# Patient Record
Sex: Female | Born: 1935 | Race: White | Hispanic: No | Smoking: Former smoker
Health system: Southern US, Community
[De-identification: ages and names within clinical notes are randomized; demographics above are authoritative.]

## PROBLEM LIST (undated history)

## (undated) DIAGNOSIS — K55059 Acute (reversible) ischemia of intestine, part and extent unspecified: Secondary | ICD-10-CM

## (undated) DIAGNOSIS — K219 Gastro-esophageal reflux disease without esophagitis: Secondary | ICD-10-CM

## (undated) DIAGNOSIS — K4130 Unilateral femoral hernia, with obstruction, without gangrene, not specified as recurrent: Secondary | ICD-10-CM

## (undated) DIAGNOSIS — E785 Hyperlipidemia, unspecified: Secondary | ICD-10-CM

## (undated) DIAGNOSIS — K559 Vascular disorder of intestine, unspecified: Secondary | ICD-10-CM

## (undated) DIAGNOSIS — K5790 Diverticulosis of intestine, part unspecified, without perforation or abscess without bleeding: Secondary | ICD-10-CM

## (undated) DIAGNOSIS — E039 Hypothyroidism, unspecified: Secondary | ICD-10-CM

## (undated) DIAGNOSIS — I10 Essential (primary) hypertension: Secondary | ICD-10-CM

## (undated) HISTORY — DX: Unilateral femoral hernia, with obstruction, without gangrene, not specified as recurrent: K41.30

## (undated) HISTORY — DX: Hyperlipidemia, unspecified: E78.5

## (undated) HISTORY — DX: Acute (reversible) ischemia of intestine, part and extent unspecified: K55.059

## (undated) HISTORY — DX: Vascular disorder of intestine, unspecified: K55.9

## (undated) HISTORY — DX: Diverticulosis of intestine, part unspecified, without perforation or abscess without bleeding: K57.90

## (undated) HISTORY — DX: Gastro-esophageal reflux disease without esophagitis: K21.9

## (undated) HISTORY — DX: Hypothyroidism, unspecified: E03.9

## (undated) HISTORY — DX: Essential (primary) hypertension: I10

---

## 1998-12-28 ENCOUNTER — Other Ambulatory Visit: Admission: RE | Admit: 1998-12-28 | Discharge: 1998-12-28 | Payer: Self-pay | Admitting: Family Medicine

## 2000-06-22 ENCOUNTER — Encounter: Admission: RE | Admit: 2000-06-22 | Discharge: 2000-06-22 | Payer: Self-pay | Admitting: Family Medicine

## 2000-06-22 ENCOUNTER — Encounter: Payer: Self-pay | Admitting: Family Medicine

## 2001-04-16 ENCOUNTER — Other Ambulatory Visit: Admission: RE | Admit: 2001-04-16 | Discharge: 2001-04-16 | Payer: Self-pay | Admitting: Family Medicine

## 2002-06-18 ENCOUNTER — Encounter: Payer: Self-pay | Admitting: Family Medicine

## 2002-06-18 ENCOUNTER — Encounter: Admission: RE | Admit: 2002-06-18 | Discharge: 2002-06-18 | Payer: Self-pay | Admitting: Family Medicine

## 2002-07-30 ENCOUNTER — Inpatient Hospital Stay (HOSPITAL_COMMUNITY): Admission: EM | Admit: 2002-07-30 | Discharge: 2002-07-31 | Payer: Self-pay | Admitting: Internal Medicine

## 2002-07-30 ENCOUNTER — Encounter: Payer: Self-pay | Admitting: Internal Medicine

## 2002-08-11 ENCOUNTER — Encounter: Payer: Self-pay | Admitting: Internal Medicine

## 2003-04-23 ENCOUNTER — Other Ambulatory Visit: Admission: RE | Admit: 2003-04-23 | Discharge: 2003-04-23 | Payer: Self-pay | Admitting: Family Medicine

## 2004-04-04 ENCOUNTER — Ambulatory Visit (HOSPITAL_COMMUNITY): Admission: RE | Admit: 2004-04-04 | Discharge: 2004-04-04 | Payer: Self-pay | Admitting: Family Medicine

## 2004-04-15 ENCOUNTER — Encounter: Admission: RE | Admit: 2004-04-15 | Discharge: 2004-04-15 | Payer: Self-pay | Admitting: Family Medicine

## 2005-04-28 ENCOUNTER — Other Ambulatory Visit: Admission: RE | Admit: 2005-04-28 | Discharge: 2005-04-28 | Payer: Self-pay | Admitting: Family Medicine

## 2006-07-10 ENCOUNTER — Encounter: Admission: RE | Admit: 2006-07-10 | Discharge: 2006-07-10 | Payer: Self-pay | Admitting: Family Medicine

## 2007-06-24 ENCOUNTER — Other Ambulatory Visit: Admission: RE | Admit: 2007-06-24 | Discharge: 2007-06-24 | Payer: Self-pay | Admitting: Family Medicine

## 2007-07-15 ENCOUNTER — Encounter: Admission: RE | Admit: 2007-07-15 | Discharge: 2007-07-15 | Payer: Self-pay | Admitting: Family Medicine

## 2008-07-28 ENCOUNTER — Encounter: Admission: RE | Admit: 2008-07-28 | Discharge: 2008-07-28 | Payer: Self-pay | Admitting: Geriatric Medicine

## 2009-08-31 ENCOUNTER — Encounter: Admission: RE | Admit: 2009-08-31 | Discharge: 2009-08-31 | Payer: Self-pay | Admitting: Geriatric Medicine

## 2009-11-23 ENCOUNTER — Other Ambulatory Visit: Admission: RE | Admit: 2009-11-23 | Discharge: 2009-11-23 | Payer: Self-pay | Admitting: Geriatric Medicine

## 2010-03-29 ENCOUNTER — Encounter: Payer: Self-pay | Admitting: Internal Medicine

## 2010-03-29 ENCOUNTER — Telehealth: Payer: Self-pay | Admitting: Internal Medicine

## 2010-03-30 ENCOUNTER — Ambulatory Visit: Payer: Self-pay | Admitting: Internal Medicine

## 2010-03-30 DIAGNOSIS — I10 Essential (primary) hypertension: Secondary | ICD-10-CM | POA: Insufficient documentation

## 2010-03-30 DIAGNOSIS — IMO0001 Reserved for inherently not codable concepts without codable children: Secondary | ICD-10-CM

## 2010-03-30 DIAGNOSIS — E039 Hypothyroidism, unspecified: Secondary | ICD-10-CM | POA: Insufficient documentation

## 2010-03-30 DIAGNOSIS — E785 Hyperlipidemia, unspecified: Secondary | ICD-10-CM

## 2010-03-30 DIAGNOSIS — K5730 Diverticulosis of large intestine without perforation or abscess without bleeding: Secondary | ICD-10-CM | POA: Insufficient documentation

## 2010-03-30 LAB — CONVERTED CEMR LAB
BUN: 14 mg/dL (ref 6–23)
Basophils Absolute: 0.1 10*3/uL (ref 0.0–0.1)
Calcium: 9.9 mg/dL (ref 8.4–10.5)
Creatinine, Ser: 1 mg/dL (ref 0.4–1.2)
GFR calc non Af Amer: 60.01 mL/min (ref >60.00)
Glucose, Bld: 119 mg/dL — ABNORMAL HIGH (ref 70–99)
Lymphocytes Relative: 19.6 % (ref 12.0–46.0)
Lymphs Abs: 2.2 10*3/uL (ref 0.7–4.0)
Monocytes Relative: 6.2 % (ref 3.0–12.0)
Neutrophils Relative %: 73.2 % (ref 43.0–77.0)
Platelets: 188 10*3/uL (ref 150.0–400.0)
RDW: 13.6 % (ref 11.5–14.6)

## 2010-03-31 ENCOUNTER — Ambulatory Visit: Payer: Self-pay | Admitting: Internal Medicine

## 2010-04-22 ENCOUNTER — Ambulatory Visit: Payer: Self-pay | Admitting: Internal Medicine

## 2010-04-22 DIAGNOSIS — K55059 Acute (reversible) ischemia of intestine, part and extent unspecified: Secondary | ICD-10-CM

## 2010-05-02 ENCOUNTER — Telehealth: Payer: Self-pay | Admitting: Internal Medicine

## 2010-05-19 ENCOUNTER — Encounter: Payer: Self-pay | Admitting: Internal Medicine

## 2010-05-19 ENCOUNTER — Ambulatory Visit
Admission: RE | Admit: 2010-05-19 | Discharge: 2010-05-19 | Payer: Self-pay | Source: Home / Self Care | Attending: Internal Medicine | Admitting: Internal Medicine

## 2010-05-24 NOTE — Letter (Signed)
Summary: CT Letter  [facility]  [address] [place], [place] [address]   Phone: [phone]  Fax: [phone]    [facility] CT Division  03/30/2010  Patient's Name: Lacey Rodriguez MRN: [id number]  The [place] Matter (Patient Protection and [employer] Act, Sections [phone] and [phone], dated March 23. 2010) mandates that we provide information to Medicare/Medicaid patients on facilities other than those owned by [employer].   Section [phone] of the Act amends the in-office ancillary services exception under [place] laws by requiring a referring physician to inform patients in writing, at the time of a referral, that the patients may obtain specified imaging services such as a CT exam from an entity other than the designated imaging equipment requested from the referring physician.  The provision specifically requires the referring physician to provide the patient with a written list of suppliers who furnish such services in the area in which the patient resides.  [facility] imaging is pleased to provide you with the Medicare required information as it relates to CT services so that you an make an informed decision. [facility] takes pride in delivering high quality services where same day and next day exams are available. When your exam is performed at the [facility] CT scanner, the results are accessible to physicians and are retained in your electronic medical chart, where your physician can review the results at any time and compare them to any previous or future images you may have.  We hope you will continue to allow [place] the privilege of using our [place] services. Your scheduler has provided you a list of Imaging Centers within a radius of this office and your signature below indicates you have been given this information to make a wise choice.         Patient Signature:            Date:         The above signature verifies that the patient was given this letter and  a choice in their imaging location.

## 2010-05-24 NOTE — Letter (Signed)
Summary: New Patient letter  [facility]  [address] [place], [place] [address]   Phone: [phone]  Fax: [phone]       03/29/2010 MRN: [id number]  Lacey Rodriguez [address] [place] [address] [place], [place]  [address]  Dear [name],  Welcome to the Gastroenterology Division at [facility].    You are scheduled to see Dr.  Leone Payor on 05-13-10 at 2pm on the 3rd floor at [facility], [address]  We ask that you try to arrive at our office 15 minutes prior to your appointment time to allow for check-in.  We would like you to complete the enclosed self-administered evaluation form prior to your visit and bring it with you on the day of your appointment.  We will review it with you.  Also, please bring a complete list of all your medications or, if you prefer, bring the medication bottles and we will list them.  Please bring your insurance card so that we may make a copy of it.  If your insurance requires a referral to see a specialist, please bring your referral form from your primary care physician.  Co-payments are due at the time of your visit and may be paid by cash, check or credit card.     Your office visit will consist of a consult with your physician (includes a physical exam), any laboratory testing he/she may order, scheduling of any necessary diagnostic testing (e.g. x-ray, ultrasound, CT-scan), and scheduling of a procedure (e.g. Endoscopy, Colonoscopy) if required.  Please allow enough time on your schedule to allow for any/all of these possibilities.    If you cannot keep your appointment, please call [phone] to cancel or reschedule prior to your appointment date.  This allows [place] the opportunity to schedule an appointment for another patient in need of care.  If you do not cancel or reschedule by 5 p.m. the business day prior to your appointment date, you will be charged a $50.00 late cancellation/no-show fee.    Thank you for choosing  [facility] for your medical needs.  We appreciate the opportunity to care for you.  Please visit [place] at our website  to learn more about our practice.                     Sincerely,                                                             The Gastroenterology Division

## 2010-05-24 NOTE — Assessment & Plan Note (Signed)
Summary: Rectal bleeding/abdominal pain/LRH   History of Present Illness Visit Type: Follow-up Visit Primary GI MD: Stan Head MD [facility] Primary Provider: Merlene Laughter, MD Chief Complaint: rectal bleeding abdominal pain since Monday night History of Present Illness:   77 YO FEMALE KNOWN TO DR. Leone Payor WITH HX OF DIVERTICULOSIS. SHE HAD AN EPISODE OF SEGMENTAL ISCHEMIC COLITIS IN 2004,AND WAS HOSPITALIZED. SHE HAD COLONOSCOPY AT THAT TIME AS WELL. SHE COMES IN TODAY AS AN ADD-ON WITH C/O ACUTE ONSET OF INTENSE ABDOMINAL CRAMPING AND PAIN ON  03/28/10 BEFORE SHE WENT TO BED. THIS WAS ASSOCIATED WITH NAUSEA, VOMOTING AND CLAMMINESS. SHE STARTED HAVING BM'S BUT DIDN'T LOOK INITIALLY TO SEE IF SHE WAS PASSING BLOOD. EARLY YESTERDAY MORNING SHE PASSED 2 BM'S THAT WERE GROSSLY BLOODY. SHE SAYS SHE FEELS EXACTLY LIKE SHE DID IN 2004WITH  THE ISCHEMIC COLITIS.  SHE FEELS A LITTLE BETTER TODAY,WAS ABLE TO EAT SOME CEREAL THIS AM,HAD ONE EPISODE OF LOOSE STOOL WITH BLOOD THIS AM, AND NONE DURING THE NIGHT . NO FEVER, NO DIZZINESS.   GI Review of Systems    Reports abdominal pain, nausea, and  vomiting.     Location of  Abdominal pain: lower abdomen.    Denies acid reflux, belching, bloating, chest pain, dysphagia with liquids, dysphagia with solids, heartburn, loss of appetite, vomiting blood, weight loss, and  weight gain.      Reports rectal bleeding.     Denies anal fissure, black tarry stools, change in bowel habit, constipation, diarrhea, diverticulosis, fecal incontinence, heme positive stool, hemorrhoids, irritable bowel syndrome, jaundice, light color stool, liver problems, and  rectal pain. Preventive Screening-Counseling & Management  Alcohol-Tobacco     Smoking Status: quit      Drug Use:  no.      Current Medications (verified): 1)  Alendronate Sodium 10 Mg Tabs (Alendronate Sodium) .Marland Kitchen.. 1 By Mouth Once Daily 2)  Vytorin 10-20 Mg Tabs (Ezetimibe-Simvastatin) .Marland Kitchen.. 1 By Mouth Once  Daily 3)  Synthroid 75 Mcg Tabs (Levothyroxine Sodium) .Marland Kitchen.. 1 By Mouth Once Daily 4)  Amlodipine Besylate 5 Mg Tabs (Amlodipine Besylate) .Marland Kitchen.. 1 By Mouth Once Daily 5)  Hydrochlorothiazide 12.5 Mg Caps (Hydrochlorothiazide) .Marland Kitchen.. 1 By Mouth Once Daily 6)  Prilosec 20 Mg Cpdr (Omeprazole) .Marland Kitchen.. 1 By Mouth Once Daily 7)  Caltrate 600+d Plus 600-400 Mg-Unit Tabs (Calcium Carbonate-Vit D-Min) .Marland Kitchen.. 1 By Mouth Once Daily 8)  Vitamin D 1000 Unit Tabs (Cholecalciferol) .Marland Kitchen.. 1 By Mouth Once Daily  Allergies (verified): 1)  ! Pcn 2)  ! Codeine  Past History:  Past Medical History: Hypothyroidism Hypertension Hyperlipidemia Ischemic Colitis 2004 Diverticulosis Hemorrhoids  Past Surgical History: Appendectomy COLONOSCOPY 2004/SEVERE SIGMOID DIVERTICULOSIS,EXT HEMORRHOIDS,SEGMENTAL ISCHEMIC COLITIS.  Family History: Reviewed history and no changes required. No FH of Colon Cancer: Family History of Liver Cancer:mother  Social History: Reviewed history and no changes required. Divorced  Patient is a former smoker.  Alcohol Use - no Illicit Drug Use - no Smoking Status:  quit Drug Use:  no  Review of Systems  The patient denies allergy/sinus, anemia, anxiety-new, arthritis/joint pain, back pain, blood in urine, breast changes/lumps, change in vision, confusion, cough, coughing up blood, depression-new, fainting, fatigue, fever, headaches-new, hearing problems, heart murmur, heart rhythm changes, itching, menstrual pain, muscle pains/cramps, night sweats, nosebleeds, pregnancy symptoms, shortness of breath, skin rash, sleeping problems, sore throat, swelling of feet/legs, swollen lymph glands, thirst - excessive , urination - excessive , urination changes/pain, urine leakage, vision changes, and voice change.  SEE HPI  Vital Signs:  Patient profile:   77 year old female Height:      64.5 inches Weight:      137 pounds BMI:     23.24 Pulse rate:   80 / minute Pulse rhythm:    regular BP sitting:   130 / 78  (left arm)  Vitals Entered By: Milford Cage NCMA (March 30, 2010 10:26 AM)  Physical Exam  General:  Well developed, well nourished, no acute distress. Head:  Normocephalic and atraumatic. Eyes:  PERRLA, no icterus. Lungs:  Clear throughout to auscultation. Heart:  Regular rate and rhythm; no murmurs, rubs,  or bruits. Abdomen:  SOFT, TENDER LLQ,LMQ, NO GUARDING, NO REBOUND, NO MASS OR HSM,BS+ Rectal:  MUCOID  FRESH BLOOD  ON EXAM GLOVE Extremities:  No clubbing, cyanosis, edema or deformities noted. Neurologic:  Alert and  oriented x4;  grossly normal neurologically. Psych:  Alert and cooperative. Normal mood and affect.   Impression & Recommendations:  Problem # 1:  RECTAL BLEEDING (ICD-569.3) Assessment New 77 YO FEMALE  WITH ACUTE LOWER ABDOMINAL PAIN/CRAMPING,RECTAL BLEEDING X 48 HOURS. SXS ARE VERY CONSISTENT WITH SEGMENTAL ISCHEMIC COLITIS,WHICH PT ALSO HAD IN 2004.  LABS AS BELOW SCHEDULE FOR CT ANGIO ABD/PELVIS PUSH LIQUIDS,SOFT DIET TYLENOL AS NEEDED FOR PAIN BENTYL 10 MG- 3 TIMES DAILYAS NEEDED FOR CRAMPING FOLLOW UP IN OFFICE IN 3 WEEKS ,OR SOONER IF SXS HAVE NOT RESOLVED. Orders: GI Misc Procedure/ Radiology Order (GI Misc ) TLB-BMP (Basic Metabolic Panel-BMET) ([id number]-METABOL) TLB-CBC Platelet - w/Differential ([id number])  Problem # 2:  DIVERTICULOSIS-COLON (ICD-562.10) Assessment: Comment Only  Problem # 3:  HYPERTENSION (ICD-401.9) Assessment: Comment Only  Problem # 4:  HYPOTHYROIDISM (ICD-244.9) Assessment: Comment Only  Problem # 5:  HYPERLIPIDEMIA (ICD-272.4) Assessment: Comment Only  Patient Instructions: 1)  Please go to lab, basement level. 2)  We have scheduled the CT scan at [facility], [address] across from [facility]. 3)  We sent a prescription for Bentyl to [facility], [address].  4)  Copy sent to : Dr Merlene Laughter 5)  We made you a follow up appointment with Dr. Leone Payor on 04-22-2010  at 2:30 PM . Prescriptions: BENTYL 10 MG CAPS (DICYCLOMINE HCL) Take 1 tab 3 times daily as needed for cramping and spasms  #30 x 0   Entered by:   Lowry Ram NCMA   Authorized by:   Sammuel Cooper PA-c   Signed by:   Lowry Ram NCMA on 03/30/2010   Method used:   Electronically to        [id number]* (retail)       [address]       [place], [place]  [address]       Ph: [phone]       Fax: [phone]   RxID:   [id number]

## 2010-05-24 NOTE — Progress Notes (Signed)
Summary: Blood instead of a bm  Phone Note Call from Patient Call back at Home Phone [phone]   Call For: Dr Leone Payor Summary of Call: Says she went to bathroom and nothing but pure blood came out like a BM. Says she had this problem once before back in 2004 and Dr Leone Payor told her if it happenned again he would see her right away. Initial call taken by: Leanor Kail [facility],  March 29, 2010 4:21 PM  Follow-up for Phone Call        Patient states that she started having pain in her lower abdomen last night in the lower part of her stomach. States she had lots of pain, rates it at an 8. Passed large amount of blood in the toilet, she states she is weak but has no SOB. States she did vomit 3 times last night also. Instructed patient to go to the ER to be evaluated. Patient does not want to wait in the ER. Encouraged her to go get checked out. Appointment made with Mike Gip, PA for 03/30/10 at 10:30am. Follow-up by: Selinda Michaels RN,  March 29, 2010 4:53 PM

## 2010-05-26 NOTE — Progress Notes (Signed)
Summary: Questions  Phone Note Call from Patient Call back at Home Phone [phone]   Caller: Patient Call For: Dr. Leone Payor Reason for Call: Talk to Nurse Summary of Call: Pt wants to know if her prep wtih effcet her acid reflux Initial call taken by: [place] Johnson,  May 02, 2010 9:17 AM  Follow-up for Phone Call        patient advised that shouldn't cause a problem. All questions answered. Follow-up by: Darcey Nora RN, CGRN,  May 02, 2010 10:21 AM

## 2010-05-26 NOTE — Assessment & Plan Note (Signed)
Summary: F/U rectal bleeding, abd pain, saw A. Esterwood PA   History of Present Illness Visit Type: Follow-up Visit Primary GI MD: Stan Head MD [facility] Primary Elira Colasanti: Merlene Laughter, MD Chief Complaint: Follow up of rectal bleeding and abdominal pain History of Present Illness:   77 yo ww recently seen by Mike Gip PA-C with LLQ pain and hematochezia that turned out to be ischemic colitis. Patient denies any blood in her stool but does have some left sided abdominal cramping from time to time. She states that she is feeling better since her last office visit.   Not really a pain in LLQ/flank but different than right side. It reminded her of the ischemic colitis she had in 2004.   GI Review of Systems    Reports abdominal pain.     Location of  Abdominal pain: left side.    Denies acid reflux, belching, bloating, chest pain, dysphagia with liquids, dysphagia with solids, heartburn, loss of appetite, nausea, vomiting, vomiting blood, weight loss, and  weight gain.        Denies anal fissure, black tarry stools, change in bowel habit, constipation, diarrhea, diverticulosis, fecal incontinence, heme positive stool, hemorrhoids, irritable bowel syndrome, jaundice, light color stool, liver problems, rectal bleeding, and  rectal pain.    Current Medications (verified): 1)  Alendronate Sodium 10 Mg Tabs (Alendronate Sodium) .Marland Kitchen.. 1 By Mouth Once Daily 2)  Vytorin 10-20 Mg Tabs (Ezetimibe-Simvastatin) .Marland Kitchen.. 1 By Mouth Once Daily 3)  Synthroid 75 Mcg Tabs (Levothyroxine Sodium) .Marland Kitchen.. 1 By Mouth Once Daily 4)  Amlodipine Besylate 5 Mg Tabs (Amlodipine Besylate) .Marland Kitchen.. 1 By Mouth Once Daily 5)  Hydrochlorothiazide 12.5 Mg Caps (Hydrochlorothiazide) .Marland Kitchen.. 1 By Mouth Once Daily 6)  Prilosec 20 Mg Cpdr (Omeprazole) .Marland Kitchen.. 1 By Mouth Once Daily (Holding) 7)  Caltrate 600+d Plus 600-400 Mg-Unit Tabs (Calcium Carbonate-Vit D-Min) .Marland Kitchen.. 1 By Mouth Once Daily 8)  Vitamin D 1000 Unit Tabs (Cholecalciferol)  .Marland Kitchen.. 1 By Mouth Once Daily 9)  Bentyl 10 Mg Caps (Dicyclomine Hcl) .... Take One By Mouth As Needed  Allergies (verified): 1)  ! Pcn 2)  ! Codeine  Past History:  Past Medical History: Reviewed history from 03/30/2010 and no changes required. Hypothyroidism Hypertension Hyperlipidemia Ischemic Colitis 2004 Diverticulosis Hemorrhoids  Past Surgical History: Reviewed history from 03/30/2010 and no changes required. Appendectomy COLONOSCOPY 2004/SEVERE SIGMOID DIVERTICULOSIS,EXT HEMORRHOIDS,SEGMENTAL ISCHEMIC COLITIS.  Family History: Reviewed history from 03/30/2010 and no changes required. No FH of Colon Cancer: Family History of Liver Cancer: mother  Social History: Divorced  Patient is a former smoker.  Alcohol Use - no Illicit Drug Use - no Daily Caffeine Use rare  Vital Signs:  Patient profile:   77 year old female Height:      64.5 inches Weight:      138 pounds BMI:     23.41 Pulse rate:   78 / minute Pulse rhythm:   regular BP sitting:   144 / 70  (left arm) Cuff size:   regular  Vitals Entered By: Harlow Mares CMA Duncan Dull) (April 22, 2010 2:07 PM)  Physical Exam  General:  Well developed, well nourished, no acute distress. Abdomen:  soft and nontender BS+   Impression & Recommendations:  Problem # 1:  ACUTE VASCULAR INSUFFICIENCY OF INTESTINE (ICD-557.0) Assessment Improved I think it would be prudent to examine the colon for any chronic changes given the overall scenario. She could need evaluation by a vascular surgeon re: is there any role for revascularization with  this second episode though does not appear to have a critical stenosis of the IMA. Will see what the colonoscopy shows and discuss further with her. I will also ask vascular suregry what they think. 15 mins tiime spent with her on this today. Risks, benefits,and indications of endoscopic procedure(s) were reviewed with the patient and all questions answered.  Orders: Colonoscopy  (Colon)  Patient Instructions: 1)  You have been scheduled for a colonoscopy. Please follow written prep instructions that were given to you today at your visit.  2)  Please pick up your prescription for Moviprep at the pharmacy. An electronic presription has already been sent.  3)  Copy sent to : Dr H.Stoneking 4)  The medication list was reviewed and reconciled.  All changed / newly prescribed medications were explained.  A complete medication list was provided to the patient / caregiver. Prescriptions: MOVIPREP 100 GM  SOLR (PEG-KCL-NACL-NASULF-NA ASC-C) As per prep instructions.  #1 x 0   Entered by:   Lamona Curl CMA (AAMA)   Authorized by:   Iva Boop MD, [facility]   Signed by:   Lamona Curl CMA (AAMA) on 04/22/2010   Method used:   Electronically to        [facility]* (retail)       [address]       [place], [place]  [address]       Ph: [phone]       Fax: [phone]   RxID:   [id number]

## 2010-05-26 NOTE — Procedures (Signed)
Summary: COLON   Colonoscopy  Procedure date:  08/11/2002  Findings:      Location:  [facility].   Patient Name: Lacey Rodriguez, Lacey Rodriguez MRN:  Procedure Procedures: Colonoscopy CPT: 380 [id number] 6425.    with biopsy. CPT: Q5068410.  Personnel: Endoscopist: Iva Boop, MD, [facility].  Referred By: Barbera Setters Artis Flock, MD.  Exam Location: Exam performed in Outpatient Clinic. Outpatient  Patient Consent: Procedure, Alternatives, Risks and Benefits discussed, consent obtained, from patient. Consent was obtained by the RN.  Indications Symptoms: Diarrhea Hematochezia. Abdominal pain / bloating.  Comments: Suspected ischemic colitis. Symptoms now about resolved. Hgb remains normal. History  Pre-Exam Physical: Performed Aug 11, 2002. Cardio-pulmonary exam, Rectal exam, HEENT exam , Abdominal exam, Mental status exam WNL.  Exam Exam: Extent of exam reached: Cecum, extent intended: Cecum.  The cecum was identified by appendiceal orifice and IC valve. Patient position: left side to back. Colon retroflexion performed. Images taken. ASA Classification: II. Tolerance: excellent.  Monitoring: Pulse and BP monitoring, Oximetry used. Supplemental O2 given.  Colon Prep Used Golytely for colon prep. Prep results: excellent.  Sedation Meds: Patient assessed and found to be appropriate for moderate (conscious) sedation. Sedation was managed by the Endoscopist. Fentanyl 50 mcg. given IV. Versed 5 mg. given IV.  Findings - NORMAL EXAM: Ascending Colon to Descending Colon.  OTHER FINDING: ? polyp, otherwise normal found in Cecum.  ISCHEMIC COLITIS: Sigmoid Colon. suspected. ulcers present, Edema present. Endoscopic Extent of Disease: Left-sided Colitis. Biopsy/Mucosal Abn. taken. ICD9: Colitis, Ischemic: 557.9. Comments: Linear ulcer stripe 30-20 cm. Looks like resolving ischemic colitis.  DIVERTICULOSIS: Sigmoid Colon. Not bleeding. ICD9: Diverticulosis, Colon: 562.10. Comments: Severe.    HEMORRHOIDS: External. Size: Grade II. Not bleeding. Not thrombosed. ICD9: Hemorrhoids, External: 455.3.   Assessment Abnormal examination, see findings above.  Diagnoses: 557.9: Colitis, Ischemic.  562.10: Diverticulosis, Colon.  455.3: Hemorrhoids, External.   Comments: Suspect resolving ischemic colitis. Enlarged cecal fold, ? polyp (biopsy taken). Events  Unplanned Interventions: No intervention was required.  Plans Medication Plan: Await pathology.  Patient Education: Patient given standard instructions for: Colitis. Diverticulosis. Hemorrhoids.  Disposition: After procedure patient sent to recovery. After recovery patient sent home.  Scheduling/Referral: Primary Care Provider, to Fayrene Fearing B. Kindl, MD, as planned,   Comments: I will review path and make follow-up recommendations then. She would revert to routine screening recommendations unless polyp or IBD found (doubt).  cc: Dr Fayrene Fearing Kindl     Patient  This report was created from the original endoscopy report, which was reviewed and signed by the above listed endoscopist.

## 2010-05-26 NOTE — Letter (Signed)
Summary: [facility] Instructions  [facility]  [address] [place], [place] [address]   Phone: [phone]  Fax: [phone]       Lacey Rodriguez    [birth year]    MRN: [id number]        Procedure Day /Date: Thursday 05/19/10     Arrival Time: 12:30 pm     Procedure Time: 1:30 pm     Location of Procedure:                    _x _  [facility] (4th Floor)  PREPARATION FOR COLONOSCOPY WITH MOVIPREP   Starting 5 days prior to your procedure 05/12/10 do not eat nuts, seeds, popcorn, corn, beans, peas,  salads, or any raw vegetables.  Do not take any fiber supplements (e.g. Metamucil, Citrucel, and Benefiber).  THE DAY BEFORE YOUR PROCEDURE         DATE: 05/18/10  DAY: Wednesday  1.  Drink clear liquids the entire day-NO SOLID FOOD  2.  Do not drink anything colored red or purple.  Avoid juices with pulp.  No orange juice.  3.  Drink at least 64 oz. (8 glasses) of fluid/clear liquids during the day to prevent dehydration and help the prep work efficiently.  CLEAR LIQUIDS INCLUDE: Water Jello Ice Popsicles Tea (sugar ok, no milk/cream) Powdered fruit flavored drinks Coffee (sugar ok, no milk/cream) Gatorade Juice: apple, white grape, white cranberry  Lemonade Clear bullion, consomm, broth Carbonated beverages (any kind) Strained chicken noodle soup Hard Candy                             4.  In the morning, mix first dose of MoviPrep solution:    Empty 1 Pouch A and 1 Pouch B into the disposable container    Add lukewarm drinking water to the top line of the container. Mix to dissolve    Refrigerate (mixed solution should be used within 24 hrs)  5.  Begin drinking the prep at 5:00 p.m. The MoviPrep container is divided by 4 marks.   Every 15 minutes drink the solution down to the next mark (approximately 8 oz) until the full liter is complete.   6.  Follow completed prep with 16 oz of clear liquid of your choice (Nothing red or purple).   Continue to drink clear liquids until bedtime.  7.  Before going to bed, mix second dose of MoviPrep solution:    Empty 1 Pouch A and 1 Pouch B into the disposable container    Add lukewarm drinking water to the top line of the container. Mix to dissolve    Refrigerate  THE DAY OF YOUR PROCEDURE      DATE: 05/18/10 DAY: Thursday  Beginning at  8:30 a.m. (5 hours before procedure):         1. Every 15 minutes, drink the solution down to the next mark (approx 8 oz) until the full liter is complete.  2. Follow completed prep with 16 oz. of clear liquid of your choice.    3. You may drink clear liquids until 11:30 am (2 HOURS BEFORE PROCEDURE).   MEDICATION INSTRUCTIONS  Unless otherwise instructed, you should take regular prescription medications with a small sip of water   as early as possible the morning of your procedure.        OTHER INSTRUCTIONS  You will need a responsible adult at least 75  years of age to accompany you and drive you home.   This person must remain in the waiting room during your procedure.  Wear loose fitting clothing that is easily removed.  Leave jewelry and other valuables at home.  However, you may wish to bring a book to read or  an iPod/MP3 player to listen to music as you wait for your procedure to start.  Remove all body piercing jewelry and leave at home.  Total time from sign-in until discharge is approximately 2-3 hours.  You should go home directly after your procedure and rest.  You can resume normal activities the  day after your procedure.  The day of your procedure you should not:   Drive   Make legal decisions   Operate machinery   Drink alcohol   Return to work  You will receive specific instructions about eating, activities and medications before you leave.    The above instructions have been reviewed and explained to me by   Lamona Curl CMA Duncan Dull)  April 22, 2010 2:56 PM     I fully understand and  can verbalize these instructions _____________________________ Date _________

## 2010-05-26 NOTE — Procedures (Signed)
Summary: Colonoscopy  Patient: [name] Note: All result statuses are Final unless otherwise noted.  Tests: (1) Colonoscopy (COL)   COL Colonoscopy           DONE (C)     [facility]     [address]     [place], [place]  [address]           COLONOSCOPY PROCEDURE REPORT           PATIENT:  Lacey Rodriguez, Lacey Rodriguez  MR#:  [id number]     BIRTHDATE:  06-Feb-1932, 78 yrs. old  GENDER:  female     ENDOSCOPIST:  Iva Boop, MD, [facility]           PROCEDURE DATE:  05/19/2010     PROCEDURE:  Colonoscopy 01093     ASA CLASS:  Class II     INDICATIONS:  ischemic colitis, follow-up     MEDICATIONS:   Fentanyl 75 mcg IV, Versed 7 mg           DESCRIPTION OF PROCEDURE:   After the risks benefits and     alternatives of the procedure were thoroughly explained, informed     consent was obtained.  Digital rectal exam was performed and     revealed no abnormalities.   The LB [id number]-[id number] endoscope     was introduced through the anus and advanced to the cecum, which     was identified by both the appendix and ileocecal valve, without     limitations.  The quality of the prep was excellent, using     MoviPrep.  The instrument was then slowly withdrawn as the colon     was fully examined. Insertion: 3:39 minutes Withdrawal; 7:15     minutes     <<PROCEDUREIMAGES>>           FINDINGS:  Severe diverticulosis was found in the sigmoid colon.     This was otherwise a normal examination of the colon.   Retroflexed     views in the rectum revealed no abnormalities.    The scope was     then withdrawn from the patient and the procedure completed.           COMPLICATIONS:  None     ENDOSCOPIC IMPRESSION:     1) Severe diverticulosis in the sigmoid colon     2) Otherwise normal examination, excellent prep     RECOMMENDATIONS:     Should she develop recurrent ischemic colitis, then consider     surgical referral. After these results and further review of CT I     do not think she needs  that now.     REPEAT EXAM:  In for as needed.           Iva Boop, MD, [facility]           CC:  Merlene Laughter, MD     The Patient           n.     REVISED:  05/19/2010 02:08 PM     eSIGNED:   Iva Boop at 05/19/2010 02:08 PM           Rolling, [place], [id number]  Note: An exclamation mark (!) indicates a result that was not dispersed into the flowsheet. Document Creation Date: 05/19/2010 2:09 PM _______________________________________________________________________  (1) Order result status: Final Collection or observation date-time: 05/19/2010 13:57 Requested date-time:  Receipt date-time:  Reported date-time:  Referring Physician:  Ordering Physician: Stan Head [phone]) Specimen Source:  Source: Launa Grill Order Number: [id number] Lab site:

## 2010-05-26 NOTE — Letter (Signed)
Summary: [facility]  [facility]   Imported By: Lennie Odor 04/01/2010 11:08:27  _____________________________________________________________________  External Attachment:    Type:   Image     Comment:   External Document

## 2010-08-23 ENCOUNTER — Other Ambulatory Visit: Payer: Self-pay | Admitting: Geriatric Medicine

## 2010-08-23 DIAGNOSIS — Z1231 Encounter for screening mammogram for malignant neoplasm of breast: Secondary | ICD-10-CM

## 2010-09-09 NOTE — Discharge Summary (Signed)
   NAMEDORENE, BRUNI                       ACCOUNT NO.:  [id number]   MEDICAL RECORD NO.:  [id number]                   PATIENT TYPE:  INP   LOCATION:  0354                                 FACILITY:  [facility]   PHYSICIAN:  Iva Boop, M.D. [facility]           DATE OF BIRTH:  01-03-1932   DATE OF ADMISSION:  07/30/2002  DATE OF DISCHARGE:  07/31/2002                                 DISCHARGE SUMMARY   DISCHARGE DIAGNOSES:  1. Ischemic colitis.  2. Gastroesophageal reflux disease.  3. Osteoporosis.  4. Anxiety, situational with recent death of her sister.  Concomitant grief     reaction.  5. Diverticulosis.  6. Hypothyroidism.   PROCEDURES:  CT scan of the abdomen and pelvis with contrast on 07/30/02,  demonstrating thickening of the left colon just below the splenic flexure  down to the sigmoid.  Sigmoid diverticulosis.  Hepatic cyst.   HISTORY OF PRESENT ILLNESS:  Please see my dictated note.   HOSPITAL COURSE:  The patient was admitted after flexible sigmoidoscopy in  Dr. Blair Heys office showed gross blood.  She had approximately 24 to 36 hours  of crampy abdominal pain and hematochezia.  Hemoglobin was monitored, it was  13.8 and then 13.3 the following day.  She initially had a low-grade fever,  but that resolved.  She was about the same overnight.  Abdomen with mild  left lower quadrant tenderness, no rebound, bowel sounds present.  Scanty  bloody stools overnight.  She was discharged from the hospital, and plans  for elective outpatient colonoscopy were undertaken because of her stable  course and the patient's desire to attend her sister's funeral today.   DISCHARGE MEDICATIONS:  1. Fosamax 10 mg daily.  2. Caltrate Plus 600 mg b.i.d.  3. Synthroid 75 mcg daily.  4. Prilosec 20 mg daily.  5. Darvocet-N 100 one or two q.6h. p.r.n. pain.  6. Ambien 10 mg q.h.s. p.r.n. sleep.   FOLLOWUP:  She will have a CBC in my office on 08/04/02.  We will schedule  outpatient  colonoscopy within the next two weeks.  She is instructed to call  our office if she has worsening symptoms or recurrent problems.    DIET:  Full liquids, advancing to blands/soft as tolerated.   I appreciate the opportunity to care for this patient.                                               Iva Boop, M.D. [facility]    CEG/MEDQ  D:  07/31/2002  T:  08/01/2002  Job:  [id number]   cc:   Quita Skye. Artis Flock, M.D.  [address], [address]  [place]  [place] [address]  Fax: [phone]

## 2010-09-09 NOTE — H&P (Signed)
NAME:  Lacey Rodriguez, Lacey Rodriguez                       ACCOUNT NO.:  [id number]   MEDICAL RECORD NO.:  [id number]                   PATIENT TYPE:  INP   LOCATION:  0354                                 FACILITY:  [facility]   PHYSICIAN:  Iva Boop, M.D. [facility]           DATE OF BIRTH:  Jan 14, 1932   DATE OF ADMISSION:  07/30/2002  DATE OF DISCHARGE:                                HISTORY & PHYSICAL   CHIEF COMPLAINT:  Gastrointestinal bleeding.   HISTORY OF PRESENT ILLNESS:  This is a pleasant 77 year old white woman who  has been under a lot of stress with the death of her sister recently.  She  awakened early in the a.m. of Tuesday, 07/29/02, with crampy abdominal pain,  had a bowel movement, then went back and noticed that she was having more  cramps, nausea with pain, but no vomiting, no presyncopal symptoms.  Then  she started having bowel movements with bright red blood in small amounts,  frequent bowel movements approximately one to two tablespoons of blood in  them.  Because of these problems, she was seen by Dr. Artis Flock today and he  performed a sigmoidoscopy to about 65 cm, which she says became quite crampy  at that point.  He could not see anything but blood in the colon.  She did  not have problems like this before.  There has been no significant  nonsteroidal abuse or use, and she has not been taking any diet pills or  over-the-counter cold remedies.  She has had some additional stress, she has  been upset over the loss of her pet dog back in January as well.  She has  not been sleeping well.  Otherwise, she has no significant ongoing problems  and her review of systems is otherwise negative at this time.   MEDICATIONS:  1. Fosamax.  2. Caltrate.  3. Synthroid 75 mcg daily.  4. Prilosec 20 mg daily.   ALLERGIES:  1. PENICILLIN.  2. CODEINE.   PAST MEDICAL HISTORY:  1. Osteoporosis.  2. Hypothyroidism.  3. Menopausal.  4. Mild dyslipidemia.  5. Gastroesophageal reflux  disease.  6. Osteoarthritis of the hands.  7. Status post appendectomy.  8. Former smoker.   SOCIAL HISTORY:  She lives with her boyfriend of 20+ years.  She does not  smoke or drink.   FAMILY HISTORY:  No colon cancer reported.  Her brother died of liver  cancer.  Mother also died with liver cancer.  Her sister sounds like what  sounds like diverticulitis.  She passed away just this week because of  unclear reasons.  A prolonged illness that was expected for some time now.   REVIEW OF SYMPTOMS:  She has some arthritis pains, but she denies any use of  nonsteroidals.  Rare Advil use at times.   All other systems appear negative at this time.   PHYSICAL EXAMINATION:  GENERAL:  A pleasant, somewhat anxious  Caucasian  woman in no acute distress.  VITAL SIGNS:  Temperature 100.3, blood pressure  189/86, pulse 87, respirations 20.  HEENT:  Eyes are anicteric.  Mouth shows  dentures.  NECK:  Supple, no mass or thyromegaly.  CHEST:  Clear.  HEART:  S1 and S2, no murmurs, rubs, or gallops.  ABDOMEN:  Soft with some mild left  lower quadrant tenderness and some mild epigastric tenderness.  Bowel sounds  are present.  There is no organomegaly or mass.  RECTAL:  Not performed  given the recent flexible sigmoidoscopy results.  LYMPH NODES:  No neck or  supraclavicular nodes palpated.  EXTREMITIES:  Free of edema.  SKIN:  Free  of acute rash.  MENTAL STATUS:  She is alert and oriented x3.   LABORATORY DATA:  We do not have any from today.  She had a hemoglobin of  13.7 on 04/14/02, CMET was normal at that time.  Platelets and white blood  cell count normal at that time.  TSH was 1.413 then.   ASSESSMENT:  Clinical scenario is most compatible with ischemic colitis.  There are additional psychosocial issues in that she would like to attend  her sister's funeral tomorrow at 2 p.m.  Other possibilities include  diverticular bleeding, given the pain that seems less likely, the  possibility of  polyp or tumor or AVM bleeding.  Those are usually painless  and have antecedent symptoms for a longer duration.  Additional problems are  hypothyroidism, osteoporosis, osteoarthritis, and gastroesophageal reflux  disease.   PLAN:  1. Admit to hydrate for serial hemoglobin.  2. CT scan to help aid in the diagnosis.  If her hemoglobin if stable and     she does not appear to have significant bleeding and the CT scan findings     are consistent with ischemic colitis, we will plan to let her go to the     funeral tomorrow unless there are other circumstances that arise.  We     then plan on elective colonoscopy as long as she recovers.   I appreciate the opportunity to care for this patient.                                                Iva Boop, M.D. [facility]    CEG/MEDQ  D:  07/30/2002  T:  07/31/2002  Job:  [id number]   cc:   Quita Skye. Artis Flock, M.D.  [address], [address]  [place]  [place] [address]  Fax: [phone]

## 2010-09-26 ENCOUNTER — Ambulatory Visit
Admission: RE | Admit: 2010-09-26 | Discharge: 2010-09-26 | Disposition: A | Discharge status: Home / Self Care | Payer: Medicare Other | Source: Ambulatory Visit | Attending: Geriatric Medicine | Admitting: Geriatric Medicine

## 2010-09-26 DIAGNOSIS — Z1231 Encounter for screening mammogram for malignant neoplasm of breast: Secondary | ICD-10-CM

## 2011-05-22 DIAGNOSIS — D3130 Benign neoplasm of unspecified choroid: Secondary | ICD-10-CM | POA: Diagnosis not present

## 2011-05-22 DIAGNOSIS — H2510 Age-related nuclear cataract, unspecified eye: Secondary | ICD-10-CM | POA: Diagnosis not present

## 2011-05-22 DIAGNOSIS — H52209 Unspecified astigmatism, unspecified eye: Secondary | ICD-10-CM | POA: Diagnosis not present

## 2011-07-11 DIAGNOSIS — I129 Hypertensive chronic kidney disease with stage 1 through stage 4 chronic kidney disease, or unspecified chronic kidney disease: Secondary | ICD-10-CM | POA: Diagnosis not present

## 2011-07-11 DIAGNOSIS — Z1331 Encounter for screening for depression: Secondary | ICD-10-CM | POA: Diagnosis not present

## 2011-07-11 DIAGNOSIS — Z0000 Encounter for general adult medical examination without abnormal findings: Secondary | ICD-10-CM | POA: Diagnosis not present

## 2011-07-11 DIAGNOSIS — E782 Mixed hyperlipidemia: Secondary | ICD-10-CM | POA: Diagnosis not present

## 2011-07-11 DIAGNOSIS — Z79899 Other long term (current) drug therapy: Secondary | ICD-10-CM | POA: Diagnosis not present

## 2011-07-18 DIAGNOSIS — D235 Other benign neoplasm of skin of trunk: Secondary | ICD-10-CM | POA: Diagnosis not present

## 2011-07-18 DIAGNOSIS — L570 Actinic keratosis: Secondary | ICD-10-CM | POA: Diagnosis not present

## 2011-07-25 DIAGNOSIS — J40 Bronchitis, not specified as acute or chronic: Secondary | ICD-10-CM | POA: Diagnosis not present

## 2011-11-17 DIAGNOSIS — T1510XA Foreign body in conjunctival sac, unspecified eye, initial encounter: Secondary | ICD-10-CM | POA: Diagnosis not present

## 2012-01-09 DIAGNOSIS — E7800 Pure hypercholesterolemia, unspecified: Secondary | ICD-10-CM | POA: Diagnosis not present

## 2012-01-09 DIAGNOSIS — R3129 Other microscopic hematuria: Secondary | ICD-10-CM | POA: Diagnosis not present

## 2012-01-09 DIAGNOSIS — Z79899 Other long term (current) drug therapy: Secondary | ICD-10-CM | POA: Diagnosis not present

## 2012-01-09 DIAGNOSIS — M25569 Pain in unspecified knee: Secondary | ICD-10-CM | POA: Diagnosis not present

## 2012-01-09 DIAGNOSIS — I129 Hypertensive chronic kidney disease with stage 1 through stage 4 chronic kidney disease, or unspecified chronic kidney disease: Secondary | ICD-10-CM | POA: Diagnosis not present

## 2012-01-15 ENCOUNTER — Other Ambulatory Visit: Payer: Self-pay | Admitting: Geriatric Medicine

## 2012-01-15 DIAGNOSIS — Z1231 Encounter for screening mammogram for malignant neoplasm of breast: Secondary | ICD-10-CM

## 2012-02-05 ENCOUNTER — Ambulatory Visit
Admission: RE | Admit: 2012-02-05 | Discharge: 2012-02-05 | Disposition: A | Discharge status: Home / Self Care | Payer: Medicare Other | Source: Ambulatory Visit | Attending: Geriatric Medicine | Admitting: Geriatric Medicine

## 2012-02-05 DIAGNOSIS — Z1231 Encounter for screening mammogram for malignant neoplasm of breast: Secondary | ICD-10-CM | POA: Diagnosis not present

## 2012-05-15 DIAGNOSIS — R3129 Other microscopic hematuria: Secondary | ICD-10-CM | POA: Diagnosis not present

## 2012-07-02 DIAGNOSIS — D3130 Benign neoplasm of unspecified choroid: Secondary | ICD-10-CM | POA: Diagnosis not present

## 2012-07-02 DIAGNOSIS — H2510 Age-related nuclear cataract, unspecified eye: Secondary | ICD-10-CM | POA: Diagnosis not present

## 2012-07-16 DIAGNOSIS — E782 Mixed hyperlipidemia: Secondary | ICD-10-CM | POA: Diagnosis not present

## 2012-07-16 DIAGNOSIS — J40 Bronchitis, not specified as acute or chronic: Secondary | ICD-10-CM | POA: Diagnosis not present

## 2012-07-16 DIAGNOSIS — Z79899 Other long term (current) drug therapy: Secondary | ICD-10-CM | POA: Diagnosis not present

## 2012-07-16 DIAGNOSIS — I129 Hypertensive chronic kidney disease with stage 1 through stage 4 chronic kidney disease, or unspecified chronic kidney disease: Secondary | ICD-10-CM | POA: Diagnosis not present

## 2012-07-16 DIAGNOSIS — Z1331 Encounter for screening for depression: Secondary | ICD-10-CM | POA: Diagnosis not present

## 2012-07-16 DIAGNOSIS — E039 Hypothyroidism, unspecified: Secondary | ICD-10-CM | POA: Diagnosis not present

## 2012-07-16 DIAGNOSIS — Z0000 Encounter for general adult medical examination without abnormal findings: Secondary | ICD-10-CM | POA: Diagnosis not present

## 2012-08-07 DIAGNOSIS — L820 Inflamed seborrheic keratosis: Secondary | ICD-10-CM | POA: Diagnosis not present

## 2012-08-07 DIAGNOSIS — D485 Neoplasm of uncertain behavior of skin: Secondary | ICD-10-CM | POA: Diagnosis not present

## 2012-11-06 DIAGNOSIS — T6391XA Toxic effect of contact with unspecified venomous animal, accidental (unintentional), initial encounter: Secondary | ICD-10-CM | POA: Diagnosis not present

## 2012-11-27 IMAGING — CT CT ANGIO PELVIS
3 of 13 series · 12 of 47 positions shown, 18 images · IV contrast (Omnipaque 300)
Comparison: None available

CTA ABDOMEN

CLINICAL DATA: Ischemic colitis, rectal bleeding, abdominal pain.
Previous appendectomy.

CT ANGIOGRAPHY ABDOMEN AND PELVIS
TECHNIQUE: Multidetector CT imaging of the abdomen and pelvis
using the standard protocol during bolus administration of
intravenous contrast. Multiplanar reconstructed images obtained and
reviewed to evaluate the vascular anatomy.
Contrast: 100 ml Omnipaque 350 IV

[Series 4: cta mesenteric arterial · axial · arterial · 0.56mm/px · z∈[+946,+1020]mm · 4 of 103 slices shown]
[im 11/103  soft-tissue]
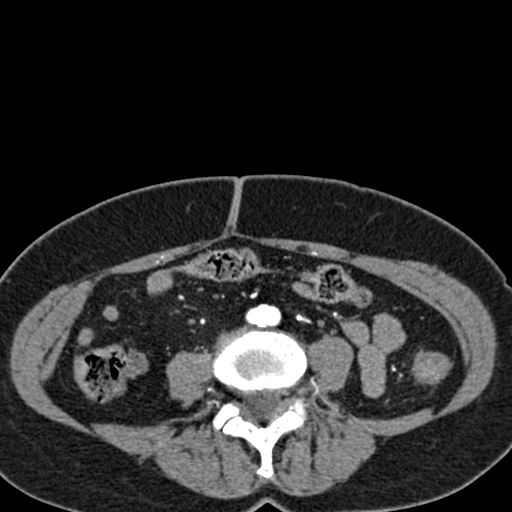
[im 21/103  soft-tissue]
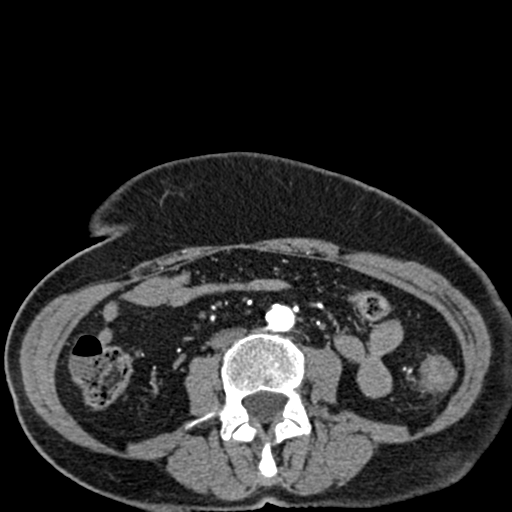
[im 31/103  soft-tissue]
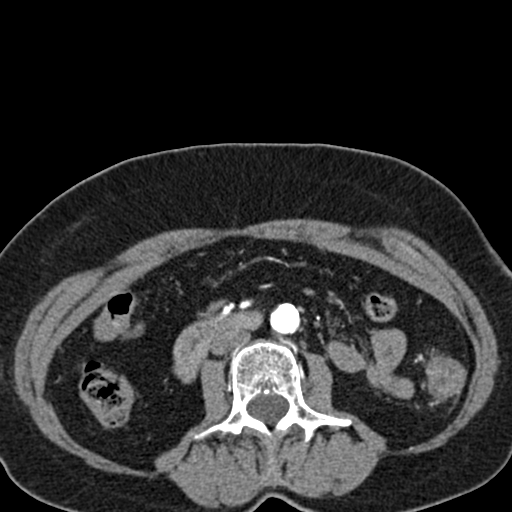
[im 41/103  soft-tissue]
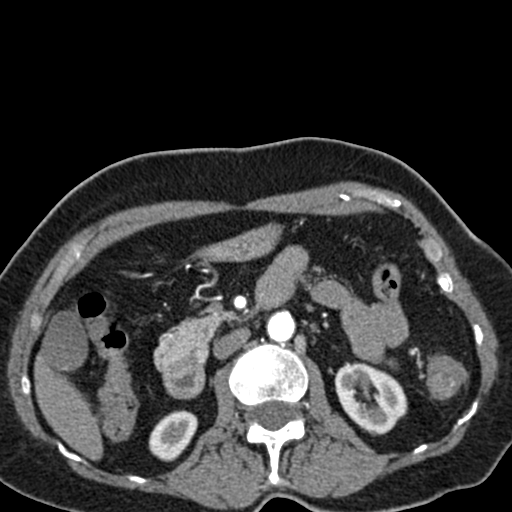

[Series 5: cta mesenteric portal-venous · axial · portal-venous · 0.57mm/px · z∈[+788,+1118]mm · 7 of 90 slices shown, 12 images]
[im 12/90  soft-tissue]
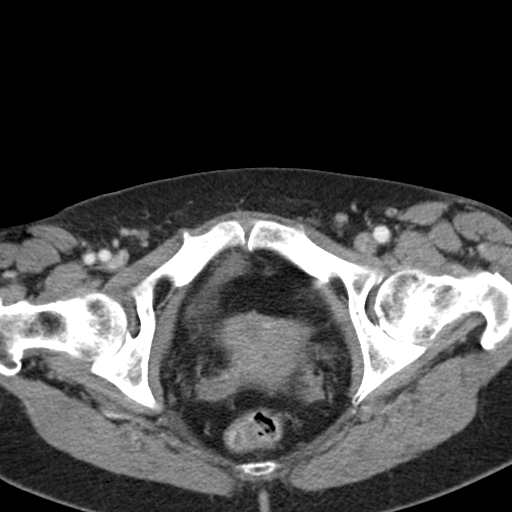
[im 12/90  bone]
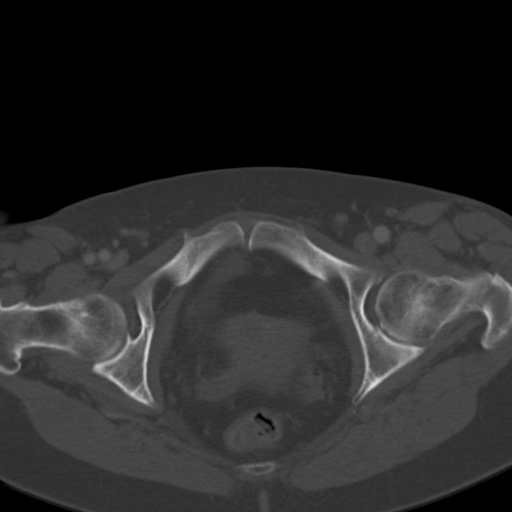
[im 23/90  soft-tissue]
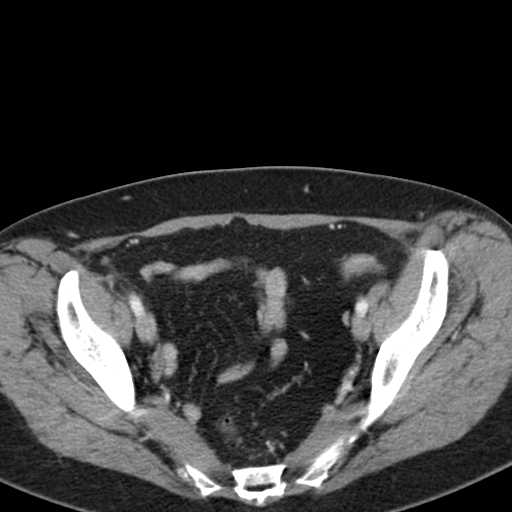
[im 34/90  soft-tissue]
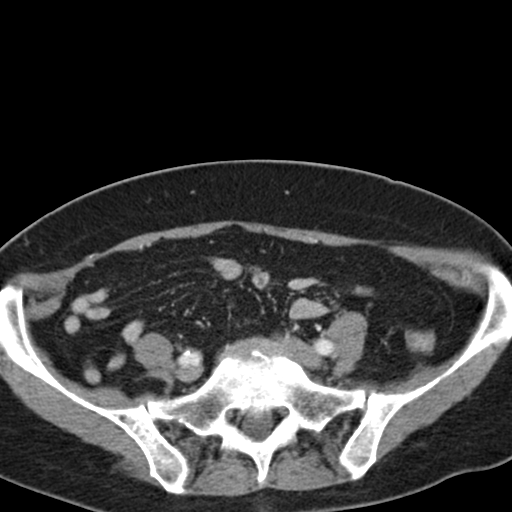
[im 45/90  soft-tissue]
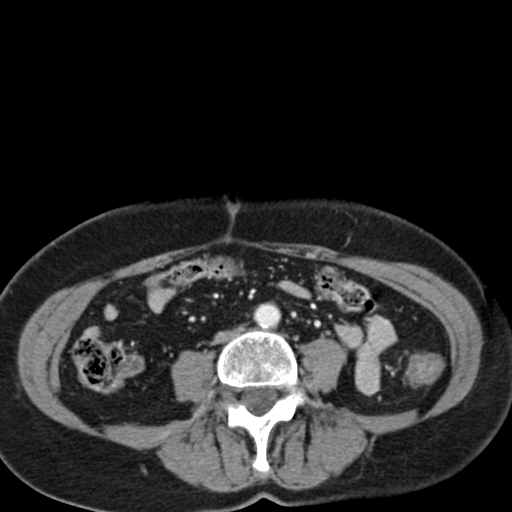
[im 45/90  lung]
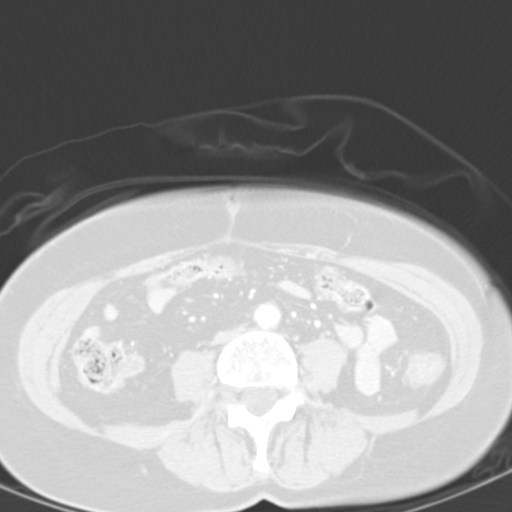
[im 56/90  soft-tissue]
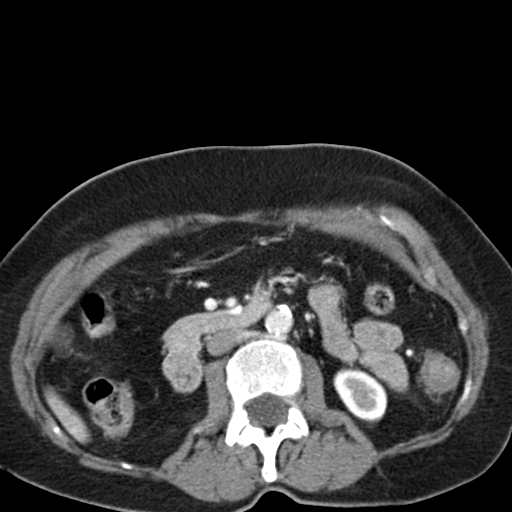
[im 56/90  lung]
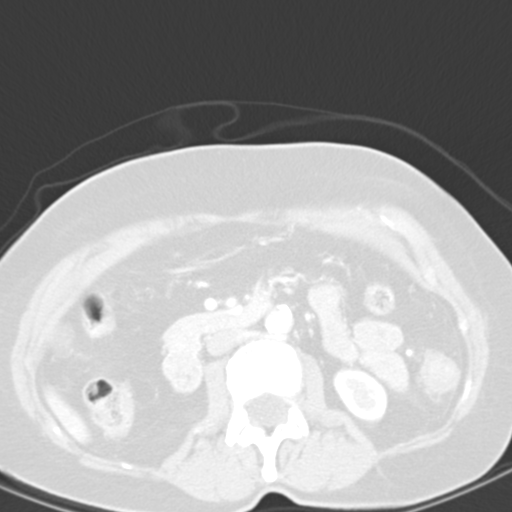
[im 67/90  soft-tissue]
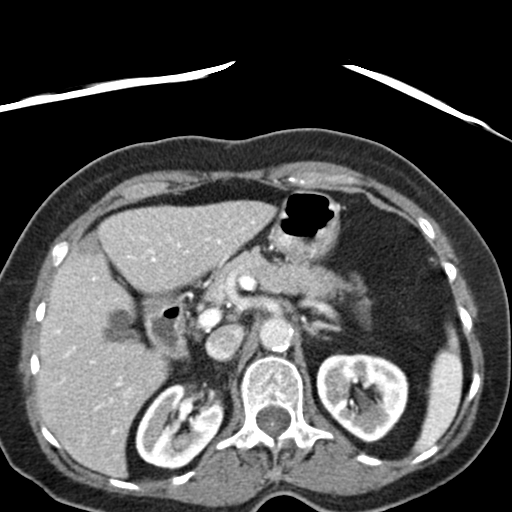
[im 67/90  lung]
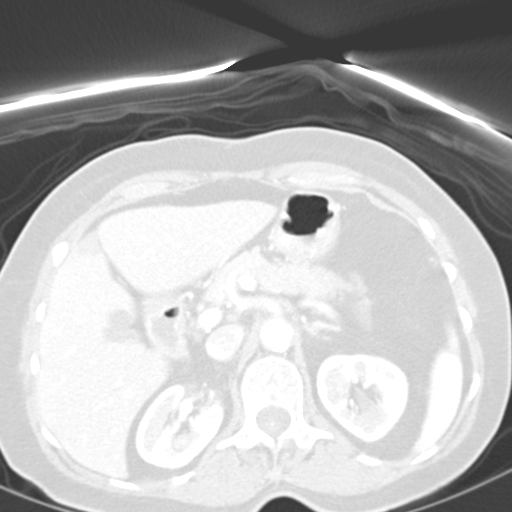
[im 78/90  soft-tissue]
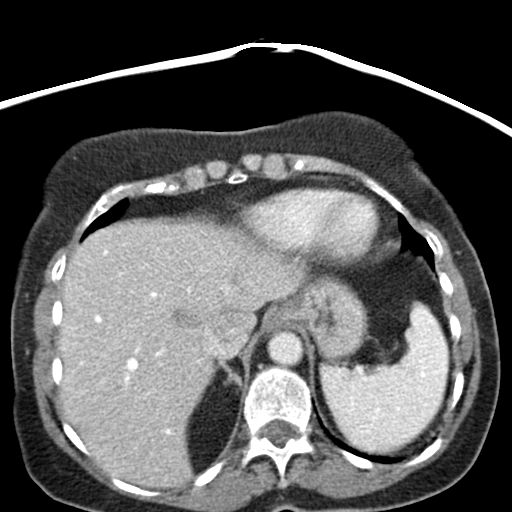
[im 78/90  lung]
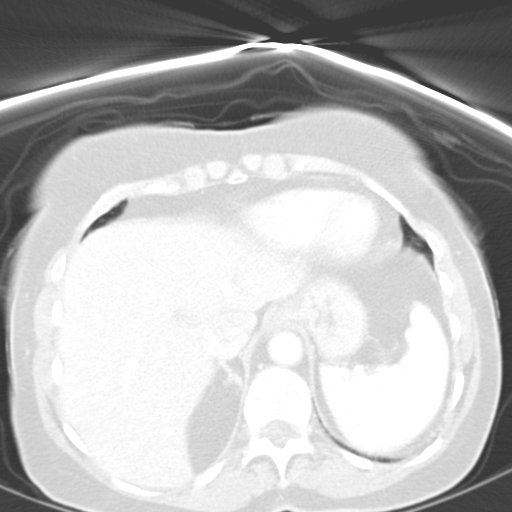

[Series 602: <mpr thick range> · coronal · 0.56mm/px · 1 of 94 slices shown, 2 images]
[im 47/94  soft-tissue]
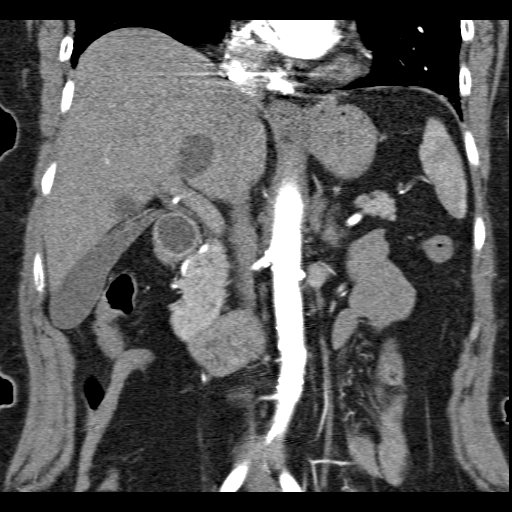
[im 47/94  bone]
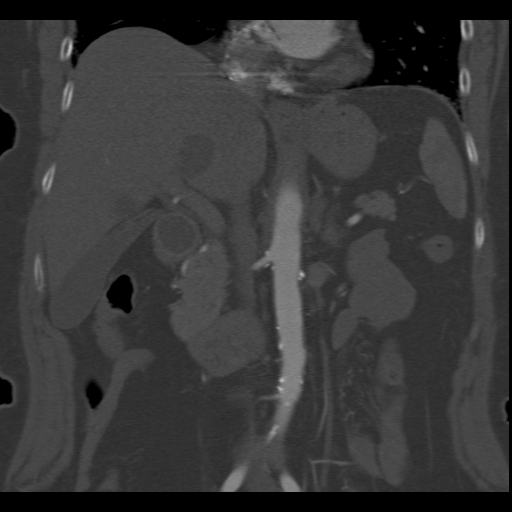

[12 of 47 positions shown; findings below may reference images not displayed]

FINDINGS: There is mild diffuse atheromatous irregularity of the
abdominal aorta without dissection, aneurysm, or stenosis.

There is partially calcified plaque at the origin of the celiac
axis without significant stenosis.

There is partially calcified plaque at the origin of the SMA
without stenosis.  There is classic distal branching anatomy.

There is eccentric calcified ostial plaque involving the left renal
artery resulting approximately 50% diameter stenosis over a segment
of less than 1 cm.  The vessel is unremarkable distally.

There is partially calcified plaque at the origin of the single
right renal artery without significant stenosis.

The inferior mesenteric artery has mild narrowing at its origin
related to aortic wall plaque, but is unremarkable distally.

The venous  phase shows patency of the portal venous branches,
splenic vein, superior mesenteric vein, and bilateral renal veins.

There is minimal dependent atelectasis in the visualized lung
bases.  2.1 cm probable simple cyst in the caudate lobe of the
liver.  2.7 cm simple cyst adjacent to gallbladder fossa.  4 mm low
attenuation nonspecific lesion in the posterior right hepatic
segment near the tip.  Unremarkable spleen, adrenal glands, renal
parenchyma, pancreas.  Small bowel decompressed.  No free air.  No
ascites.  No retroperitoneal or mesenteric adenopathy.
IMPRESSION: 1.  Ostial stenosis of the inferior mesenteric artery, with widely
patent SMA and celiac axis, likely insufficient to account for
occlusive mesenteric ischemia.
2.  Bilateral ostial renal artery plaque without high-grade
stenosis.

3.  Hepatic cysts

CTA PELVIS
FINDINGS: There is partially calcified plaque about the aortic
bifurcation.  The remainder of the iliac arterial system is
unremarkable without dissection, aneurysm, or stenosis.

The colon is nondilated.  There is mild circumferential wall
thickening throughout the descending portion of the colon with some
mild adjacent inflammatory/edematous changes.  Multiple diverticula
are noted   from the sigmoid colon, which is incompletely
distended.  Urinary bladder is nondistended.  Unremarkable uterus.
No ascites.
IMPRESSION: 1.  Unremarkable aortic bifurcation and iliac arterial system.
2.  Descending colitis.
3.  Sigmoid diverticulosis.

## 2012-12-27 DIAGNOSIS — R42 Dizziness and giddiness: Secondary | ICD-10-CM | POA: Diagnosis not present

## 2013-01-14 DIAGNOSIS — I129 Hypertensive chronic kidney disease with stage 1 through stage 4 chronic kidney disease, or unspecified chronic kidney disease: Secondary | ICD-10-CM | POA: Diagnosis not present

## 2013-01-14 DIAGNOSIS — E782 Mixed hyperlipidemia: Secondary | ICD-10-CM | POA: Diagnosis not present

## 2013-01-14 DIAGNOSIS — K219 Gastro-esophageal reflux disease without esophagitis: Secondary | ICD-10-CM | POA: Diagnosis not present

## 2013-01-14 DIAGNOSIS — Z79899 Other long term (current) drug therapy: Secondary | ICD-10-CM | POA: Diagnosis not present

## 2013-02-26 ENCOUNTER — Other Ambulatory Visit: Payer: Self-pay

## 2013-02-26 DIAGNOSIS — Z1231 Encounter for screening mammogram for malignant neoplasm of breast: Secondary | ICD-10-CM

## 2013-03-18 ENCOUNTER — Ambulatory Visit
Admission: RE | Admit: 2013-03-18 | Discharge: 2013-03-18 | Disposition: A | Discharge status: Home / Self Care | Payer: Medicare Other | Source: Ambulatory Visit

## 2013-03-18 DIAGNOSIS — Z1231 Encounter for screening mammogram for malignant neoplasm of breast: Secondary | ICD-10-CM | POA: Diagnosis not present

## 2013-07-08 DIAGNOSIS — D3130 Benign neoplasm of unspecified choroid: Secondary | ICD-10-CM | POA: Diagnosis not present

## 2013-07-08 DIAGNOSIS — H5200 Hypermetropia, unspecified eye: Secondary | ICD-10-CM | POA: Diagnosis not present

## 2013-07-08 DIAGNOSIS — H2510 Age-related nuclear cataract, unspecified eye: Secondary | ICD-10-CM | POA: Diagnosis not present

## 2013-07-21 DIAGNOSIS — N183 Chronic kidney disease, stage 3 unspecified: Secondary | ICD-10-CM | POA: Diagnosis not present

## 2013-07-21 DIAGNOSIS — E039 Hypothyroidism, unspecified: Secondary | ICD-10-CM | POA: Diagnosis not present

## 2013-07-21 DIAGNOSIS — Z79899 Other long term (current) drug therapy: Secondary | ICD-10-CM | POA: Diagnosis not present

## 2013-07-21 DIAGNOSIS — I129 Hypertensive chronic kidney disease with stage 1 through stage 4 chronic kidney disease, or unspecified chronic kidney disease: Secondary | ICD-10-CM | POA: Diagnosis not present

## 2013-07-21 DIAGNOSIS — Z0000 Encounter for general adult medical examination without abnormal findings: Secondary | ICD-10-CM | POA: Diagnosis not present

## 2013-07-21 DIAGNOSIS — Z1331 Encounter for screening for depression: Secondary | ICD-10-CM | POA: Diagnosis not present

## 2013-07-21 DIAGNOSIS — E782 Mixed hyperlipidemia: Secondary | ICD-10-CM | POA: Diagnosis not present

## 2013-08-07 DIAGNOSIS — Z23 Encounter for immunization: Secondary | ICD-10-CM | POA: Diagnosis not present

## 2014-01-19 DIAGNOSIS — I129 Hypertensive chronic kidney disease with stage 1 through stage 4 chronic kidney disease, or unspecified chronic kidney disease: Secondary | ICD-10-CM | POA: Diagnosis not present

## 2014-01-19 DIAGNOSIS — E782 Mixed hyperlipidemia: Secondary | ICD-10-CM | POA: Diagnosis not present

## 2014-01-19 DIAGNOSIS — N183 Chronic kidney disease, stage 3 unspecified: Secondary | ICD-10-CM | POA: Diagnosis not present

## 2014-02-02 DIAGNOSIS — I129 Hypertensive chronic kidney disease with stage 1 through stage 4 chronic kidney disease, or unspecified chronic kidney disease: Secondary | ICD-10-CM | POA: Diagnosis not present

## 2014-02-02 DIAGNOSIS — E782 Mixed hyperlipidemia: Secondary | ICD-10-CM | POA: Diagnosis not present

## 2014-02-02 DIAGNOSIS — N183 Chronic kidney disease, stage 3 (moderate): Secondary | ICD-10-CM | POA: Diagnosis not present

## 2014-02-24 DIAGNOSIS — M8580 Other specified disorders of bone density and structure, unspecified site: Secondary | ICD-10-CM | POA: Diagnosis not present

## 2014-03-24 DIAGNOSIS — J069 Acute upper respiratory infection, unspecified: Secondary | ICD-10-CM | POA: Diagnosis not present

## 2014-05-04 ENCOUNTER — Other Ambulatory Visit: Payer: Self-pay

## 2014-05-04 DIAGNOSIS — Z1231 Encounter for screening mammogram for malignant neoplasm of breast: Secondary | ICD-10-CM

## 2014-05-06 ENCOUNTER — Ambulatory Visit
Admission: RE | Admit: 2014-05-06 | Discharge: 2014-05-06 | Disposition: A | Discharge status: Home / Self Care | Payer: Medicare Other | Source: Ambulatory Visit

## 2014-05-06 DIAGNOSIS — Z1231 Encounter for screening mammogram for malignant neoplasm of breast: Secondary | ICD-10-CM

## 2014-07-13 DIAGNOSIS — D3132 Benign neoplasm of left choroid: Secondary | ICD-10-CM | POA: Diagnosis not present

## 2014-07-13 DIAGNOSIS — H52203 Unspecified astigmatism, bilateral: Secondary | ICD-10-CM | POA: Diagnosis not present

## 2014-07-13 DIAGNOSIS — H2513 Age-related nuclear cataract, bilateral: Secondary | ICD-10-CM | POA: Diagnosis not present

## 2014-07-27 DIAGNOSIS — N183 Chronic kidney disease, stage 3 (moderate): Secondary | ICD-10-CM | POA: Diagnosis not present

## 2014-07-27 DIAGNOSIS — I129 Hypertensive chronic kidney disease with stage 1 through stage 4 chronic kidney disease, or unspecified chronic kidney disease: Secondary | ICD-10-CM | POA: Diagnosis not present

## 2014-07-27 DIAGNOSIS — E039 Hypothyroidism, unspecified: Secondary | ICD-10-CM | POA: Diagnosis not present

## 2014-07-27 DIAGNOSIS — Z91038 Other insect allergy status: Secondary | ICD-10-CM | POA: Diagnosis not present

## 2014-07-27 DIAGNOSIS — E780 Pure hypercholesterolemia: Secondary | ICD-10-CM | POA: Diagnosis not present

## 2014-07-27 DIAGNOSIS — Z79899 Other long term (current) drug therapy: Secondary | ICD-10-CM | POA: Diagnosis not present

## 2014-07-27 DIAGNOSIS — Z0000 Encounter for general adult medical examination without abnormal findings: Secondary | ICD-10-CM | POA: Diagnosis not present

## 2014-11-01 ENCOUNTER — Telehealth: Payer: Self-pay | Admitting: Internal Medicine

## 2014-11-01 NOTE — Telephone Encounter (Signed)
Called c/o lower abdominal and back pain. Has not been seen in our office > 4 1/2 years. I did not feel comfortable diagnosing or treating over the phone. Told to go to ER if she thought it was severe. Otherwise call the office in the am. She has seen by Dr. Carlean Purl previously

## 2014-11-02 ENCOUNTER — Telehealth: Payer: Self-pay | Admitting: Internal Medicine

## 2014-11-02 NOTE — Telephone Encounter (Signed)
She could also go to [facility] walk-in/urgent care clinic - she is an Lacey Rodriguez patient

## 2014-11-02 NOTE — Telephone Encounter (Signed)
Patient notified

## 2014-11-02 NOTE — Telephone Encounter (Signed)
Patient spoke with Dr. Henrene Pastor last night and he recommended ER eval.  She has lower abdominal pain, rectal bleeding, and "feels weak". She reports that the pain started on Saturday while at the beach.  She is not able to eat.  "I just don't feel well"  She has a history of ischemic colitis in 2011 and she feels this is the same problem.  She reports that she did not go to the ER as recommended last night.  She wants to be seen today.  I explained that I do not have any available appts until Thursday.  I again recommended ER evaluation today.  She wants "something called in".  I explained that she needs an examination, blood tests, possible imaging, and maybe fluids.  She is very reluctant to go to the ER.  "They already know what is wrong with me".  I reiterated that she should go to the ER now.  I did give her a follow up appt with Nicoletta Ba PA on Thursday at 3:00, but asked that she go to the ER now for eval;./  She stated "I probably will"

## 2014-11-05 ENCOUNTER — Other Ambulatory Visit (INDEPENDENT_AMBULATORY_CARE_PROVIDER_SITE_OTHER): Payer: Medicare Other

## 2014-11-05 ENCOUNTER — Ambulatory Visit (INDEPENDENT_AMBULATORY_CARE_PROVIDER_SITE_OTHER): Payer: Medicare Other | Admitting: Physician Assistant

## 2014-11-05 ENCOUNTER — Encounter: Payer: Self-pay | Admitting: Physician Assistant

## 2014-11-05 VITALS — BP 120/68 | HR 64 | Ht 64.5 in | Wt 131.0 lb

## 2014-11-05 DIAGNOSIS — K559 Vascular disorder of intestine, unspecified: Secondary | ICD-10-CM

## 2014-11-05 LAB — CBC WITH DIFFERENTIAL/PLATELET
Basophils Absolute: 0 10*3/uL (ref 0.0–0.1)
Basophils Relative: 0.3 % (ref 0.0–3.0)
Eosinophils Absolute: 0.1 10*3/uL (ref 0.0–0.7)
Eosinophils Relative: 1.1 % (ref 0.0–5.0)
HCT: 40.4 % (ref 36.0–46.0)
HEMOGLOBIN: 13.5 g/dL (ref 12.0–15.0)
LYMPHS ABS: 2.1 10*3/uL (ref 0.7–4.0)
LYMPHS PCT: 23.7 % (ref 12.0–46.0)
MCHC: 33.4 g/dL (ref 30.0–36.0)
MCV: 89.1 fl (ref 78.0–100.0)
MONO ABS: 0.6 10*3/uL (ref 0.1–1.0)
Monocytes Relative: 6.9 % (ref 3.0–12.0)
NEUTROS ABS: 5.9 10*3/uL (ref 1.4–7.7)
NEUTROS PCT: 68 % (ref 43.0–77.0)
Platelets: 204 10*3/uL (ref 150.0–400.0)
RBC: 4.53 Mil/uL (ref 3.87–5.11)
RDW: 13.3 % (ref 11.5–15.5)
WBC: 8.7 10*3/uL (ref 4.0–10.5)

## 2014-11-05 LAB — BASIC METABOLIC PANEL
BUN: 17 mg/dL (ref 6–23)
CALCIUM: 9.6 mg/dL (ref 8.4–10.5)
CO2: 33 meq/L — AB (ref 19–32)
Chloride: 100 mEq/L (ref 96–112)
Creatinine, Ser: 1.1 mg/dL (ref 0.40–1.20)
GFR: 50.68 mL/min — AB (ref >60.00)
GLUCOSE: 100 mg/dL — AB (ref 70–99)
POTASSIUM: 3.4 meq/L — AB (ref 3.5–5.1)
SODIUM: 140 meq/L (ref 135–145)

## 2014-11-05 MED ORDER — DICYCLOMINE HCL 10 MG PO CAPS: ORAL_CAPSULE | ORAL | Status: AC

## 2014-11-05 NOTE — Progress Notes (Signed)
Patient ID: Lacey Rodriguez, female   DOB: [birth year], 81 y.o.   MRN: [id number]   Subjective:    Patient ID: Lacey Rodriguez, female    DOB: [birth year], 81 y.o.   MRN: [id number]  HPI Lacey Rodriguez is a very nice 81 year old white female known to Dr. Carlean Purl. She was last seen here in 2012 at which time she had colonoscopy. She was noted to have severe sigmoid diverticulosis and otherwise negative exam. Lacey Rodriguez She has had history of segmental ischemic colitis and underwent a CT angiogram of her abdomen in 2011 after an episode. She was found to have IMA stenosis which was not critical the celiac and SMA were widely patent. Patient relates that she had an episode in 2004, then in 2011 and had done well until this past weekend. She says she was at the beach where she [place] a place and had been working hard in the yard over the weekend. On Sunday morning at about 4:20 AM she was awakened from sleep with the very same feeling she's had in the past with bad crampy lower abdominal pain. This was followed by diaphoresis nausea and episode of vomiting and then started having bowel movements. As she didn't look and thought initially she was having diarrhea and then realize she was passing blood. She continued to have bleeding through Sunday into Monday. She says she didn't eat anything for 2 days due to the abdominal cramping. At this point she is able to eat but is still having lower abdominal discomfort. She has not seen any blood over the past 48 hours. She had not had any changes in medications dosages etc. on careful questioning she says she had sweated through a couple of T -shirts  throughout the day doing yardwork in the heat on Saturday prior to onset of this episode. She is frustrated because she called here  earlier in the week and was advised to go to the emergency room. She had asked for a prescription for Bentyl but was unable to obtain that because she had not been here for a few years. After she had also  asked to be worked in with me, and couldn't be seen until today.  Review of Systems Pertinent positive and negative review of systems were noted in the above HPI section.  All other review of systems was otherwise negative.  Outpatient Encounter Prescriptions as of 11/05/2014  Medication Sig  . amLODipine (NORVASC) 5 MG tablet Take 5 mg by mouth daily.  . Calcium-Vitamin D (CALTRATE 600 PLUS-VIT D PO) Take by mouth.  . EPINEPHrine (EPIPEN IJ) Inject as directed as needed.  . ezetimibe-simvastatin (VYTORIN) 10-20 MG per tablet Take 1 tablet by mouth daily.  . hydrochlorothiazide (MICROZIDE) 12.5 MG capsule Take 12.5 mg by mouth daily.  Marland Kitchen levothyroxine (SYNTHROID, LEVOTHROID) 50 MCG tablet Take 50 mcg by mouth daily before breakfast.  . omeprazole (PRILOSEC) 40 MG capsule Take 40 mg by mouth daily.  Marland Kitchen dicyclomine (BENTYL) 10 MG capsule One capsule by mouth every 6 hours as needed pain   No facility-administered encounter medications on file as of 11/05/2014.   Allergies  Allergen Reactions  . Codeine   . Penicillins    Patient Active Problem List   Diagnosis Date Noted  . Ischemic colitis 11/05/2014  . ACUTE VASCULAR INSUFFICIENCY OF INTESTINE 04/22/2010  . HYPOTHYROIDISM 03/30/2010  . HYPERLIPIDEMIA 03/30/2010  . HYPERTENSION 03/30/2010  . UNILATERAL/UNSPEC FEMORAL HERNIA W/OBSTRUCTION 03/30/2010  . DIVERTICULOSIS-COLON 03/30/2010   History   Social History  .  Marital Status: Divorced    Spouse Name: N/A  . Number of Children: N/A  . Years of Education: N/A   Occupational History  . Not on file.   Social History Main Topics  . Smoking status: Former Smoker    Quit date: 04/24/1964  . Smokeless tobacco: Never Used  . Alcohol Use: No  . Drug Use: No  . Sexual Activity: Not on file   Other Topics Concern  . Not on file   Social History Narrative  . No narrative on file    Lacey Rodriguez's family history is not on file.      Objective:    Filed Vitals:    11/05/14 1509  BP: 120/68  Pulse: 64    Physical Exam   well-developed elderly white female in no acute distress, pleasant blood pressure 120/68 pulse 64 height 5 foot 4 weight 131. HEENT; nontraumatic normocephalic EOMI PERRLA sclera anicteric, Cardiovascular; regular rate and rhythm with S1-S2 no murmur rub or gallop, Pulmonary; clear bilaterally, Abdomen ;is nondistended ,bowel sounds are active there is no audible bruit she has mild tenderness in the left lower quadrant there is no guarding or rebound., Rectal; exam not done, Extremities; no clubbing cyanosis or edema skin warm and dry, Psych ;mood and affect appropriate       Assessment & Plan:   #1 81 yo female with segmental ischemic colitis- this episode certainly precipitated by volume depletion/dehydration. IMA stenosis on prior CT angio Last episode as in 2011 She is improving. #2 diverticulosis #3 HTN  Plan; soft diet, push fluids- pt stated she is bad about not drinking fluids even when working outdoors in the heat- we discussed importance of good hydration, especially with her hx and use of BP meds etc. Will check CBC, BMET today  Bentyl 10 mg q 6 hours prn cramping Will need further imaging  if recurrent episode in near future  She will follow up with Dr. Carlean Purl or myself   Lacey Rodriguez Genia Harold PA-C 11/05/2014   Cc: Lajean Manes, MD

## 2014-11-05 NOTE — Patient Instructions (Addendum)
Your physician has requested that you go to the basement for the following lab work before leaving today:CBC, Bmet.   We have sent the following medications to your pharmacy for you to pick up at your convenience: Bentyl.  You have had ischemic colitis.   Push your fluids daily.

## 2014-11-16 NOTE — Progress Notes (Signed)
Agree with Ms. Esterwood's assessment and plan. Carl E. Gessner, MD, FACG   

## 2014-12-18 DIAGNOSIS — Z880 Allergy status to penicillin: Secondary | ICD-10-CM | POA: Diagnosis not present

## 2014-12-18 DIAGNOSIS — M25552 Pain in left hip: Secondary | ICD-10-CM | POA: Diagnosis not present

## 2014-12-18 DIAGNOSIS — Y9289 Other specified places as the place of occurrence of the external cause: Secondary | ICD-10-CM | POA: Diagnosis not present

## 2014-12-18 DIAGNOSIS — Z91030 Bee allergy status: Secondary | ICD-10-CM | POA: Diagnosis not present

## 2014-12-18 DIAGNOSIS — Z885 Allergy status to narcotic agent status: Secondary | ICD-10-CM | POA: Diagnosis not present

## 2014-12-18 DIAGNOSIS — S4992XA Unspecified injury of left shoulder and upper arm, initial encounter: Secondary | ICD-10-CM | POA: Diagnosis not present

## 2014-12-18 DIAGNOSIS — Y9389 Activity, other specified: Secondary | ICD-10-CM | POA: Diagnosis not present

## 2014-12-18 DIAGNOSIS — I10 Essential (primary) hypertension: Secondary | ICD-10-CM | POA: Diagnosis not present

## 2014-12-18 DIAGNOSIS — W1830XA Fall on same level, unspecified, initial encounter: Secondary | ICD-10-CM | POA: Diagnosis not present

## 2014-12-18 DIAGNOSIS — S92002A Unspecified fracture of left calcaneus, initial encounter for closed fracture: Secondary | ICD-10-CM | POA: Diagnosis not present

## 2014-12-23 DIAGNOSIS — S92012A Displaced fracture of body of left calcaneus, initial encounter for closed fracture: Secondary | ICD-10-CM | POA: Diagnosis not present

## 2014-12-23 DIAGNOSIS — S99922A Unspecified injury of left foot, initial encounter: Secondary | ICD-10-CM | POA: Diagnosis not present

## 2014-12-23 DIAGNOSIS — S4992XA Unspecified injury of left shoulder and upper arm, initial encounter: Secondary | ICD-10-CM | POA: Diagnosis not present

## 2015-01-19 DIAGNOSIS — S4992XD Unspecified injury of left shoulder and upper arm, subsequent encounter: Secondary | ICD-10-CM | POA: Diagnosis not present

## 2015-01-19 DIAGNOSIS — S92012D Displaced fracture of body of left calcaneus, subsequent encounter for fracture with routine healing: Secondary | ICD-10-CM | POA: Diagnosis not present

## 2015-01-25 DIAGNOSIS — I129 Hypertensive chronic kidney disease with stage 1 through stage 4 chronic kidney disease, or unspecified chronic kidney disease: Secondary | ICD-10-CM | POA: Diagnosis not present

## 2015-01-25 DIAGNOSIS — N183 Chronic kidney disease, stage 3 (moderate): Secondary | ICD-10-CM | POA: Diagnosis not present

## 2015-01-25 DIAGNOSIS — Z79899 Other long term (current) drug therapy: Secondary | ICD-10-CM | POA: Diagnosis not present

## 2015-02-15 DIAGNOSIS — Z79899 Other long term (current) drug therapy: Secondary | ICD-10-CM | POA: Diagnosis not present

## 2015-02-16 DIAGNOSIS — S92012D Displaced fracture of body of left calcaneus, subsequent encounter for fracture with routine healing: Secondary | ICD-10-CM | POA: Diagnosis not present

## 2015-02-16 DIAGNOSIS — S4992XD Unspecified injury of left shoulder and upper arm, subsequent encounter: Secondary | ICD-10-CM | POA: Diagnosis not present

## 2015-02-16 DIAGNOSIS — S99922D Unspecified injury of left foot, subsequent encounter: Secondary | ICD-10-CM | POA: Diagnosis not present

## 2015-03-16 DIAGNOSIS — S99922D Unspecified injury of left foot, subsequent encounter: Secondary | ICD-10-CM | POA: Diagnosis not present

## 2015-03-16 DIAGNOSIS — S92012D Displaced fracture of body of left calcaneus, subsequent encounter for fracture with routine healing: Secondary | ICD-10-CM | POA: Diagnosis not present

## 2015-03-29 ENCOUNTER — Other Ambulatory Visit: Payer: Self-pay

## 2015-03-29 DIAGNOSIS — Z1231 Encounter for screening mammogram for malignant neoplasm of breast: Secondary | ICD-10-CM

## 2015-04-08 DIAGNOSIS — S92012D Displaced fracture of body of left calcaneus, subsequent encounter for fracture with routine healing: Secondary | ICD-10-CM | POA: Diagnosis not present

## 2015-04-13 DIAGNOSIS — S92012D Displaced fracture of body of left calcaneus, subsequent encounter for fracture with routine healing: Secondary | ICD-10-CM | POA: Diagnosis not present

## 2015-04-13 DIAGNOSIS — S4992XD Unspecified injury of left shoulder and upper arm, subsequent encounter: Secondary | ICD-10-CM | POA: Diagnosis not present

## 2015-04-14 ENCOUNTER — Other Ambulatory Visit: Payer: Self-pay | Admitting: Sports Medicine

## 2015-04-14 DIAGNOSIS — M25512 Pain in left shoulder: Secondary | ICD-10-CM

## 2015-04-14 DIAGNOSIS — M79672 Pain in left foot: Secondary | ICD-10-CM

## 2015-04-14 DIAGNOSIS — S92012D Displaced fracture of body of left calcaneus, subsequent encounter for fracture with routine healing: Secondary | ICD-10-CM | POA: Diagnosis not present

## 2015-04-16 DIAGNOSIS — S92012D Displaced fracture of body of left calcaneus, subsequent encounter for fracture with routine healing: Secondary | ICD-10-CM | POA: Diagnosis not present

## 2015-04-20 DIAGNOSIS — S92012D Displaced fracture of body of left calcaneus, subsequent encounter for fracture with routine healing: Secondary | ICD-10-CM | POA: Diagnosis not present

## 2015-04-27 ENCOUNTER — Ambulatory Visit
Admission: RE | Admit: 2015-04-27 | Discharge: 2015-04-27 | Disposition: A | Discharge status: Home / Self Care | Payer: Medicare Other | Source: Ambulatory Visit | Attending: Sports Medicine | Admitting: Sports Medicine

## 2015-04-27 DIAGNOSIS — S46012A Strain of muscle(s) and tendon(s) of the rotator cuff of left shoulder, initial encounter: Secondary | ICD-10-CM | POA: Diagnosis not present

## 2015-04-27 DIAGNOSIS — M25512 Pain in left shoulder: Secondary | ICD-10-CM

## 2015-04-27 DIAGNOSIS — M79672 Pain in left foot: Secondary | ICD-10-CM

## 2015-04-27 DIAGNOSIS — S92002D Unspecified fracture of left calcaneus, subsequent encounter for fracture with routine healing: Secondary | ICD-10-CM | POA: Diagnosis not present

## 2015-04-28 DIAGNOSIS — S92012D Displaced fracture of body of left calcaneus, subsequent encounter for fracture with routine healing: Secondary | ICD-10-CM | POA: Diagnosis not present

## 2015-05-04 DIAGNOSIS — M67812 Other specified disorders of synovium, left shoulder: Secondary | ICD-10-CM | POA: Diagnosis not present

## 2015-05-04 DIAGNOSIS — S92012D Displaced fracture of body of left calcaneus, subsequent encounter for fracture with routine healing: Secondary | ICD-10-CM | POA: Diagnosis not present

## 2015-05-04 DIAGNOSIS — S4992XD Unspecified injury of left shoulder and upper arm, subsequent encounter: Secondary | ICD-10-CM | POA: Diagnosis not present

## 2015-05-10 DIAGNOSIS — S4992XD Unspecified injury of left shoulder and upper arm, subsequent encounter: Secondary | ICD-10-CM | POA: Diagnosis not present

## 2015-05-11 DIAGNOSIS — E039 Hypothyroidism, unspecified: Secondary | ICD-10-CM | POA: Diagnosis not present

## 2015-05-11 DIAGNOSIS — R634 Abnormal weight loss: Secondary | ICD-10-CM | POA: Diagnosis not present

## 2015-05-27 DIAGNOSIS — S92012D Displaced fracture of body of left calcaneus, subsequent encounter for fracture with routine healing: Secondary | ICD-10-CM | POA: Diagnosis not present

## 2015-05-27 DIAGNOSIS — S4992XD Unspecified injury of left shoulder and upper arm, subsequent encounter: Secondary | ICD-10-CM | POA: Diagnosis not present

## 2015-06-01 ENCOUNTER — Ambulatory Visit: Payer: Medicare Other

## 2015-06-01 DIAGNOSIS — S4992XD Unspecified injury of left shoulder and upper arm, subsequent encounter: Secondary | ICD-10-CM | POA: Diagnosis not present

## 2015-06-01 DIAGNOSIS — S92012D Displaced fracture of body of left calcaneus, subsequent encounter for fracture with routine healing: Secondary | ICD-10-CM | POA: Diagnosis not present

## 2015-06-09 DIAGNOSIS — M25512 Pain in left shoulder: Secondary | ICD-10-CM | POA: Diagnosis not present

## 2015-06-09 DIAGNOSIS — N183 Chronic kidney disease, stage 3 (moderate): Secondary | ICD-10-CM | POA: Diagnosis not present

## 2015-06-09 DIAGNOSIS — I129 Hypertensive chronic kidney disease with stage 1 through stage 4 chronic kidney disease, or unspecified chronic kidney disease: Secondary | ICD-10-CM | POA: Diagnosis not present

## 2015-06-24 DIAGNOSIS — H1131 Conjunctival hemorrhage, right eye: Secondary | ICD-10-CM | POA: Diagnosis not present

## 2015-06-28 DIAGNOSIS — D485 Neoplasm of uncertain behavior of skin: Secondary | ICD-10-CM | POA: Diagnosis not present

## 2015-06-28 DIAGNOSIS — L820 Inflamed seborrheic keratosis: Secondary | ICD-10-CM | POA: Diagnosis not present

## 2015-07-02 DIAGNOSIS — M67812 Other specified disorders of synovium, left shoulder: Secondary | ICD-10-CM | POA: Diagnosis not present

## 2015-07-02 DIAGNOSIS — S92012D Displaced fracture of body of left calcaneus, subsequent encounter for fracture with routine healing: Secondary | ICD-10-CM | POA: Diagnosis not present

## 2015-07-02 DIAGNOSIS — S4992XD Unspecified injury of left shoulder and upper arm, subsequent encounter: Secondary | ICD-10-CM | POA: Diagnosis not present

## 2015-07-19 DIAGNOSIS — H52203 Unspecified astigmatism, bilateral: Secondary | ICD-10-CM | POA: Diagnosis not present

## 2015-07-19 DIAGNOSIS — H2513 Age-related nuclear cataract, bilateral: Secondary | ICD-10-CM | POA: Diagnosis not present

## 2015-07-19 DIAGNOSIS — D3132 Benign neoplasm of left choroid: Secondary | ICD-10-CM | POA: Diagnosis not present

## 2015-07-28 DIAGNOSIS — E782 Mixed hyperlipidemia: Secondary | ICD-10-CM | POA: Diagnosis not present

## 2015-07-28 DIAGNOSIS — Z1389 Encounter for screening for other disorder: Secondary | ICD-10-CM | POA: Diagnosis not present

## 2015-07-28 DIAGNOSIS — Z79899 Other long term (current) drug therapy: Secondary | ICD-10-CM | POA: Diagnosis not present

## 2015-07-28 DIAGNOSIS — I129 Hypertensive chronic kidney disease with stage 1 through stage 4 chronic kidney disease, or unspecified chronic kidney disease: Secondary | ICD-10-CM | POA: Diagnosis not present

## 2015-07-28 DIAGNOSIS — Z7189 Other specified counseling: Secondary | ICD-10-CM | POA: Diagnosis not present

## 2015-07-28 DIAGNOSIS — N183 Chronic kidney disease, stage 3 (moderate): Secondary | ICD-10-CM | POA: Diagnosis not present

## 2015-07-28 DIAGNOSIS — Z0000 Encounter for general adult medical examination without abnormal findings: Secondary | ICD-10-CM | POA: Diagnosis not present

## 2015-08-02 DIAGNOSIS — S4982XD Other specified injuries of left shoulder and upper arm, subsequent encounter: Secondary | ICD-10-CM | POA: Diagnosis not present

## 2015-08-02 DIAGNOSIS — S92012D Displaced fracture of body of left calcaneus, subsequent encounter for fracture with routine healing: Secondary | ICD-10-CM | POA: Diagnosis not present

## 2015-09-13 ENCOUNTER — Other Ambulatory Visit: Payer: Self-pay | Admitting: Sports Medicine

## 2015-09-13 DIAGNOSIS — S92012D Displaced fracture of body of left calcaneus, subsequent encounter for fracture with routine healing: Secondary | ICD-10-CM

## 2015-09-13 DIAGNOSIS — M7502 Adhesive capsulitis of left shoulder: Secondary | ICD-10-CM | POA: Diagnosis not present

## 2015-09-16 ENCOUNTER — Ambulatory Visit
Admission: RE | Admit: 2015-09-16 | Discharge: 2015-09-16 | Disposition: A | Discharge status: Home / Self Care | Payer: Medicare Other | Source: Ambulatory Visit | Attending: Sports Medicine | Admitting: Sports Medicine

## 2015-09-16 DIAGNOSIS — S92012D Displaced fracture of body of left calcaneus, subsequent encounter for fracture with routine healing: Secondary | ICD-10-CM

## 2015-09-16 DIAGNOSIS — M7989 Other specified soft tissue disorders: Secondary | ICD-10-CM | POA: Diagnosis not present

## 2015-09-16 DIAGNOSIS — R600 Localized edema: Secondary | ICD-10-CM | POA: Diagnosis not present

## 2015-09-23 DIAGNOSIS — M19072 Primary osteoarthritis, left ankle and foot: Secondary | ICD-10-CM | POA: Diagnosis not present

## 2015-09-23 DIAGNOSIS — M7502 Adhesive capsulitis of left shoulder: Secondary | ICD-10-CM | POA: Diagnosis not present

## 2015-09-23 DIAGNOSIS — M67812 Other specified disorders of synovium, left shoulder: Secondary | ICD-10-CM | POA: Diagnosis not present

## 2015-09-23 DIAGNOSIS — S92012D Displaced fracture of body of left calcaneus, subsequent encounter for fracture with routine healing: Secondary | ICD-10-CM | POA: Diagnosis not present

## 2015-10-21 DIAGNOSIS — M19072 Primary osteoarthritis, left ankle and foot: Secondary | ICD-10-CM | POA: Diagnosis not present

## 2015-10-21 DIAGNOSIS — M7502 Adhesive capsulitis of left shoulder: Secondary | ICD-10-CM | POA: Diagnosis not present

## 2015-10-21 DIAGNOSIS — S92012D Displaced fracture of body of left calcaneus, subsequent encounter for fracture with routine healing: Secondary | ICD-10-CM | POA: Diagnosis not present

## 2015-11-04 DIAGNOSIS — M25572 Pain in left ankle and joints of left foot: Secondary | ICD-10-CM | POA: Diagnosis not present

## 2015-11-10 DIAGNOSIS — M19072 Primary osteoarthritis, left ankle and foot: Secondary | ICD-10-CM | POA: Diagnosis not present

## 2015-12-09 DIAGNOSIS — M19072 Primary osteoarthritis, left ankle and foot: Secondary | ICD-10-CM | POA: Diagnosis not present

## 2016-01-06 DIAGNOSIS — M19072 Primary osteoarthritis, left ankle and foot: Secondary | ICD-10-CM | POA: Diagnosis not present

## 2016-01-06 DIAGNOSIS — S92012D Displaced fracture of body of left calcaneus, subsequent encounter for fracture with routine healing: Secondary | ICD-10-CM | POA: Diagnosis not present

## 2016-01-27 DIAGNOSIS — N183 Chronic kidney disease, stage 3 (moderate): Secondary | ICD-10-CM | POA: Diagnosis not present

## 2016-01-27 DIAGNOSIS — I129 Hypertensive chronic kidney disease with stage 1 through stage 4 chronic kidney disease, or unspecified chronic kidney disease: Secondary | ICD-10-CM | POA: Diagnosis not present

## 2016-02-14 DIAGNOSIS — M25572 Pain in left ankle and joints of left foot: Secondary | ICD-10-CM | POA: Diagnosis not present

## 2016-03-28 DIAGNOSIS — M25572 Pain in left ankle and joints of left foot: Secondary | ICD-10-CM | POA: Diagnosis not present

## 2016-03-28 DIAGNOSIS — G8929 Other chronic pain: Secondary | ICD-10-CM | POA: Diagnosis not present

## 2016-03-28 DIAGNOSIS — M19072 Primary osteoarthritis, left ankle and foot: Secondary | ICD-10-CM | POA: Diagnosis not present

## 2016-04-20 ENCOUNTER — Ambulatory Visit: Payer: Medicare Other | Admitting: Podiatry

## 2016-04-26 DIAGNOSIS — H2513 Age-related nuclear cataract, bilateral: Secondary | ICD-10-CM | POA: Diagnosis not present

## 2016-04-26 DIAGNOSIS — D3132 Benign neoplasm of left choroid: Secondary | ICD-10-CM | POA: Diagnosis not present

## 2016-04-27 DIAGNOSIS — G8929 Other chronic pain: Secondary | ICD-10-CM | POA: Diagnosis not present

## 2016-04-27 DIAGNOSIS — M19072 Primary osteoarthritis, left ankle and foot: Secondary | ICD-10-CM | POA: Diagnosis not present

## 2016-04-27 DIAGNOSIS — M25572 Pain in left ankle and joints of left foot: Secondary | ICD-10-CM | POA: Diagnosis not present

## 2016-05-18 DIAGNOSIS — H25812 Combined forms of age-related cataract, left eye: Secondary | ICD-10-CM | POA: Diagnosis not present

## 2016-05-18 DIAGNOSIS — H2512 Age-related nuclear cataract, left eye: Secondary | ICD-10-CM | POA: Diagnosis not present

## 2016-05-26 DIAGNOSIS — M25511 Pain in right shoulder: Secondary | ICD-10-CM | POA: Diagnosis not present

## 2016-06-13 DIAGNOSIS — M25511 Pain in right shoulder: Secondary | ICD-10-CM | POA: Diagnosis not present

## 2016-06-13 DIAGNOSIS — S46211A Strain of muscle, fascia and tendon of other parts of biceps, right arm, initial encounter: Secondary | ICD-10-CM | POA: Diagnosis not present

## 2016-06-29 DIAGNOSIS — M25511 Pain in right shoulder: Secondary | ICD-10-CM | POA: Diagnosis not present

## 2016-06-29 DIAGNOSIS — S46211D Strain of muscle, fascia and tendon of other parts of biceps, right arm, subsequent encounter: Secondary | ICD-10-CM | POA: Diagnosis not present

## 2016-07-17 DIAGNOSIS — H0014 Chalazion left upper eyelid: Secondary | ICD-10-CM | POA: Diagnosis not present

## 2016-08-01 DIAGNOSIS — Z0000 Encounter for general adult medical examination without abnormal findings: Secondary | ICD-10-CM | POA: Diagnosis not present

## 2016-08-01 DIAGNOSIS — E782 Mixed hyperlipidemia: Secondary | ICD-10-CM | POA: Diagnosis not present

## 2016-08-01 DIAGNOSIS — Z1389 Encounter for screening for other disorder: Secondary | ICD-10-CM | POA: Diagnosis not present

## 2016-08-01 DIAGNOSIS — Z91038 Other insect allergy status: Secondary | ICD-10-CM | POA: Diagnosis not present

## 2016-08-01 DIAGNOSIS — N183 Chronic kidney disease, stage 3 (moderate): Secondary | ICD-10-CM | POA: Diagnosis not present

## 2016-08-01 DIAGNOSIS — Z79899 Other long term (current) drug therapy: Secondary | ICD-10-CM | POA: Diagnosis not present

## 2016-08-01 DIAGNOSIS — I129 Hypertensive chronic kidney disease with stage 1 through stage 4 chronic kidney disease, or unspecified chronic kidney disease: Secondary | ICD-10-CM | POA: Diagnosis not present

## 2016-10-06 DIAGNOSIS — M545 Low back pain: Secondary | ICD-10-CM | POA: Diagnosis not present

## 2016-10-06 DIAGNOSIS — M5136 Other intervertebral disc degeneration, lumbar region: Secondary | ICD-10-CM | POA: Diagnosis not present

## 2016-11-04 DIAGNOSIS — M545 Low back pain: Secondary | ICD-10-CM | POA: Diagnosis not present

## 2016-11-04 DIAGNOSIS — M5136 Other intervertebral disc degeneration, lumbar region: Secondary | ICD-10-CM | POA: Diagnosis not present

## 2016-11-06 ENCOUNTER — Other Ambulatory Visit: Payer: Self-pay | Admitting: Sports Medicine

## 2016-11-06 DIAGNOSIS — M545 Low back pain, unspecified: Secondary | ICD-10-CM

## 2016-11-17 ENCOUNTER — Ambulatory Visit
Admission: RE | Admit: 2016-11-17 | Discharge: 2016-11-17 | Disposition: A | Discharge status: Home / Self Care | Payer: Medicare Other | Source: Ambulatory Visit | Attending: Sports Medicine | Admitting: Sports Medicine

## 2016-11-17 DIAGNOSIS — M545 Low back pain, unspecified: Secondary | ICD-10-CM

## 2016-11-17 DIAGNOSIS — M5136 Other intervertebral disc degeneration, lumbar region: Secondary | ICD-10-CM | POA: Diagnosis not present

## 2016-11-23 DIAGNOSIS — M25511 Pain in right shoulder: Secondary | ICD-10-CM | POA: Diagnosis not present

## 2016-11-23 DIAGNOSIS — M545 Low back pain: Secondary | ICD-10-CM | POA: Diagnosis not present

## 2016-11-23 DIAGNOSIS — M5136 Other intervertebral disc degeneration, lumbar region: Secondary | ICD-10-CM | POA: Diagnosis not present

## 2016-12-26 DIAGNOSIS — M545 Low back pain: Secondary | ICD-10-CM | POA: Diagnosis not present

## 2017-01-09 DIAGNOSIS — G8929 Other chronic pain: Secondary | ICD-10-CM | POA: Diagnosis not present

## 2017-01-09 DIAGNOSIS — M25511 Pain in right shoulder: Secondary | ICD-10-CM | POA: Diagnosis not present

## 2017-01-09 DIAGNOSIS — M5136 Other intervertebral disc degeneration, lumbar region: Secondary | ICD-10-CM | POA: Diagnosis not present

## 2017-01-10 ENCOUNTER — Other Ambulatory Visit: Payer: Self-pay | Admitting: Sports Medicine

## 2017-01-10 DIAGNOSIS — M25511 Pain in right shoulder: Secondary | ICD-10-CM

## 2017-01-26 ENCOUNTER — Ambulatory Visit
Admission: RE | Admit: 2017-01-26 | Discharge: 2017-01-26 | Disposition: A | Discharge status: Home / Self Care | Payer: Medicare Other | Source: Ambulatory Visit | Attending: Sports Medicine | Admitting: Sports Medicine

## 2017-01-26 DIAGNOSIS — M25511 Pain in right shoulder: Secondary | ICD-10-CM

## 2017-01-31 DIAGNOSIS — E039 Hypothyroidism, unspecified: Secondary | ICD-10-CM | POA: Diagnosis not present

## 2017-01-31 DIAGNOSIS — I129 Hypertensive chronic kidney disease with stage 1 through stage 4 chronic kidney disease, or unspecified chronic kidney disease: Secondary | ICD-10-CM | POA: Diagnosis not present

## 2017-01-31 DIAGNOSIS — N183 Chronic kidney disease, stage 3 (moderate): Secondary | ICD-10-CM | POA: Diagnosis not present

## 2017-01-31 DIAGNOSIS — E782 Mixed hyperlipidemia: Secondary | ICD-10-CM | POA: Diagnosis not present

## 2017-02-01 DIAGNOSIS — G8929 Other chronic pain: Secondary | ICD-10-CM | POA: Diagnosis not present

## 2017-02-01 DIAGNOSIS — M75111 Incomplete rotator cuff tear or rupture of right shoulder, not specified as traumatic: Secondary | ICD-10-CM | POA: Diagnosis not present

## 2017-02-01 DIAGNOSIS — M25511 Pain in right shoulder: Secondary | ICD-10-CM | POA: Diagnosis not present

## 2017-02-06 DIAGNOSIS — S46211D Strain of muscle, fascia and tendon of other parts of biceps, right arm, subsequent encounter: Secondary | ICD-10-CM | POA: Diagnosis not present

## 2017-02-06 DIAGNOSIS — G8929 Other chronic pain: Secondary | ICD-10-CM | POA: Diagnosis not present

## 2017-02-06 DIAGNOSIS — M25511 Pain in right shoulder: Secondary | ICD-10-CM | POA: Diagnosis not present

## 2017-02-06 DIAGNOSIS — M75111 Incomplete rotator cuff tear or rupture of right shoulder, not specified as traumatic: Secondary | ICD-10-CM | POA: Diagnosis not present

## 2017-02-08 DIAGNOSIS — M5136 Other intervertebral disc degeneration, lumbar region: Secondary | ICD-10-CM | POA: Diagnosis not present

## 2017-02-24 DIAGNOSIS — M5136 Other intervertebral disc degeneration, lumbar region: Secondary | ICD-10-CM | POA: Diagnosis not present

## 2017-03-26 DIAGNOSIS — D3132 Benign neoplasm of left choroid: Secondary | ICD-10-CM | POA: Diagnosis not present

## 2017-03-26 DIAGNOSIS — H2511 Age-related nuclear cataract, right eye: Secondary | ICD-10-CM | POA: Diagnosis not present

## 2017-03-27 DIAGNOSIS — R1013 Epigastric pain: Secondary | ICD-10-CM | POA: Diagnosis not present

## 2017-04-11 DIAGNOSIS — N183 Chronic kidney disease, stage 3 (moderate): Secondary | ICD-10-CM | POA: Diagnosis not present

## 2017-04-11 DIAGNOSIS — J209 Acute bronchitis, unspecified: Secondary | ICD-10-CM | POA: Diagnosis not present

## 2017-04-11 DIAGNOSIS — I129 Hypertensive chronic kidney disease with stage 1 through stage 4 chronic kidney disease, or unspecified chronic kidney disease: Secondary | ICD-10-CM | POA: Diagnosis not present

## 2017-04-11 DIAGNOSIS — K2970 Gastritis, unspecified, without bleeding: Secondary | ICD-10-CM | POA: Diagnosis not present

## 2017-05-19 DIAGNOSIS — R05 Cough: Secondary | ICD-10-CM | POA: Diagnosis not present

## 2017-05-19 DIAGNOSIS — J069 Acute upper respiratory infection, unspecified: Secondary | ICD-10-CM | POA: Diagnosis not present

## 2017-05-31 DIAGNOSIS — H25811 Combined forms of age-related cataract, right eye: Secondary | ICD-10-CM | POA: Diagnosis not present

## 2017-05-31 DIAGNOSIS — H2511 Age-related nuclear cataract, right eye: Secondary | ICD-10-CM | POA: Diagnosis not present

## 2017-07-17 DIAGNOSIS — M25511 Pain in right shoulder: Secondary | ICD-10-CM | POA: Diagnosis not present

## 2017-08-03 DIAGNOSIS — R634 Abnormal weight loss: Secondary | ICD-10-CM | POA: Diagnosis not present

## 2017-08-03 DIAGNOSIS — Z1389 Encounter for screening for other disorder: Secondary | ICD-10-CM | POA: Diagnosis not present

## 2017-08-03 DIAGNOSIS — K219 Gastro-esophageal reflux disease without esophagitis: Secondary | ICD-10-CM | POA: Diagnosis not present

## 2017-08-03 DIAGNOSIS — I129 Hypertensive chronic kidney disease with stage 1 through stage 4 chronic kidney disease, or unspecified chronic kidney disease: Secondary | ICD-10-CM | POA: Diagnosis not present

## 2017-08-03 DIAGNOSIS — Z79899 Other long term (current) drug therapy: Secondary | ICD-10-CM | POA: Diagnosis not present

## 2017-08-03 DIAGNOSIS — Z0000 Encounter for general adult medical examination without abnormal findings: Secondary | ICD-10-CM | POA: Diagnosis not present

## 2017-08-03 DIAGNOSIS — N183 Chronic kidney disease, stage 3 (moderate): Secondary | ICD-10-CM | POA: Diagnosis not present

## 2017-08-03 DIAGNOSIS — M8588 Other specified disorders of bone density and structure, other site: Secondary | ICD-10-CM | POA: Diagnosis not present

## 2017-08-13 DIAGNOSIS — M8588 Other specified disorders of bone density and structure, other site: Secondary | ICD-10-CM | POA: Diagnosis not present

## 2017-08-13 DIAGNOSIS — M810 Age-related osteoporosis without current pathological fracture: Secondary | ICD-10-CM | POA: Diagnosis not present

## 2017-09-04 DIAGNOSIS — M5136 Other intervertebral disc degeneration, lumbar region: Secondary | ICD-10-CM | POA: Diagnosis not present

## 2017-09-07 DIAGNOSIS — D229 Melanocytic nevi, unspecified: Secondary | ICD-10-CM | POA: Diagnosis not present

## 2017-09-07 DIAGNOSIS — D1801 Hemangioma of skin and subcutaneous tissue: Secondary | ICD-10-CM | POA: Diagnosis not present

## 2017-09-07 DIAGNOSIS — L570 Actinic keratosis: Secondary | ICD-10-CM | POA: Diagnosis not present

## 2017-09-07 DIAGNOSIS — L821 Other seborrheic keratosis: Secondary | ICD-10-CM | POA: Diagnosis not present

## 2017-09-07 DIAGNOSIS — L819 Disorder of pigmentation, unspecified: Secondary | ICD-10-CM | POA: Diagnosis not present

## 2017-09-07 DIAGNOSIS — L814 Other melanin hyperpigmentation: Secondary | ICD-10-CM | POA: Diagnosis not present

## 2017-09-10 DIAGNOSIS — I129 Hypertensive chronic kidney disease with stage 1 through stage 4 chronic kidney disease, or unspecified chronic kidney disease: Secondary | ICD-10-CM | POA: Diagnosis not present

## 2017-09-10 DIAGNOSIS — N183 Chronic kidney disease, stage 3 (moderate): Secondary | ICD-10-CM | POA: Diagnosis not present

## 2017-09-10 DIAGNOSIS — M810 Age-related osteoporosis without current pathological fracture: Secondary | ICD-10-CM | POA: Diagnosis not present

## 2017-09-27 DIAGNOSIS — M5136 Other intervertebral disc degeneration, lumbar region: Secondary | ICD-10-CM | POA: Diagnosis not present

## 2017-10-03 DIAGNOSIS — M25511 Pain in right shoulder: Secondary | ICD-10-CM | POA: Diagnosis not present

## 2017-10-03 DIAGNOSIS — M7542 Impingement syndrome of left shoulder: Secondary | ICD-10-CM | POA: Diagnosis not present

## 2017-11-14 DIAGNOSIS — Z1329 Encounter for screening for other suspected endocrine disorder: Secondary | ICD-10-CM | POA: Diagnosis not present

## 2017-11-14 DIAGNOSIS — L240 Irritant contact dermatitis due to detergents: Secondary | ICD-10-CM | POA: Diagnosis not present

## 2017-11-14 DIAGNOSIS — Z5320 Procedure and treatment not carried out because of patient's decision for unspecified reasons: Secondary | ICD-10-CM | POA: Diagnosis not present

## 2017-11-14 DIAGNOSIS — Z136 Encounter for screening for cardiovascular disorders: Secondary | ICD-10-CM | POA: Diagnosis not present

## 2017-11-14 DIAGNOSIS — Z13220 Encounter for screening for lipoid disorders: Secondary | ICD-10-CM | POA: Diagnosis not present

## 2017-11-17 DIAGNOSIS — L509 Urticaria, unspecified: Secondary | ICD-10-CM | POA: Diagnosis not present

## 2017-12-12 DIAGNOSIS — M19072 Primary osteoarthritis, left ankle and foot: Secondary | ICD-10-CM | POA: Diagnosis not present

## 2017-12-12 DIAGNOSIS — E785 Hyperlipidemia, unspecified: Secondary | ICD-10-CM | POA: Diagnosis not present

## 2017-12-12 DIAGNOSIS — E039 Hypothyroidism, unspecified: Secondary | ICD-10-CM | POA: Diagnosis not present

## 2017-12-12 DIAGNOSIS — I10 Essential (primary) hypertension: Secondary | ICD-10-CM | POA: Diagnosis not present

## 2018-02-13 DIAGNOSIS — E039 Hypothyroidism, unspecified: Secondary | ICD-10-CM | POA: Diagnosis not present

## 2018-02-13 DIAGNOSIS — M19072 Primary osteoarthritis, left ankle and foot: Secondary | ICD-10-CM | POA: Diagnosis not present

## 2018-02-13 DIAGNOSIS — I10 Essential (primary) hypertension: Secondary | ICD-10-CM | POA: Diagnosis not present

## 2018-02-13 DIAGNOSIS — E785 Hyperlipidemia, unspecified: Secondary | ICD-10-CM | POA: Diagnosis not present
